# Patient Record
Sex: Male | Born: 1950 | Race: White | Hispanic: No | Smoking: Current some day smoker
Health system: Southern US, Community
[De-identification: ages and names within clinical notes are randomized; demographics above are authoritative.]

## PROBLEM LIST (undated history)

## (undated) DIAGNOSIS — M24549 Contracture, unspecified hand: Secondary | ICD-10-CM

## (undated) DIAGNOSIS — K922 Gastrointestinal hemorrhage, unspecified: Secondary | ICD-10-CM

## (undated) DIAGNOSIS — R4701 Aphasia: Secondary | ICD-10-CM

## (undated) DIAGNOSIS — E7800 Pure hypercholesterolemia, unspecified: Secondary | ICD-10-CM

## (undated) DIAGNOSIS — I10 Essential (primary) hypertension: Secondary | ICD-10-CM

## (undated) DIAGNOSIS — R1310 Dysphagia, unspecified: Secondary | ICD-10-CM

## (undated) DIAGNOSIS — H269 Unspecified cataract: Secondary | ICD-10-CM

## (undated) DIAGNOSIS — F3289 Other specified depressive episodes: Secondary | ICD-10-CM

## (undated) DIAGNOSIS — M249 Joint derangement, unspecified: Secondary | ICD-10-CM

## (undated) DIAGNOSIS — E785 Hyperlipidemia, unspecified: Secondary | ICD-10-CM

## (undated) DIAGNOSIS — G8110 Spastic hemiplegia affecting unspecified side: Secondary | ICD-10-CM

## (undated) DIAGNOSIS — I69921 Dysphasia following unspecified cerebrovascular disease: Secondary | ICD-10-CM

## (undated) DIAGNOSIS — I639 Cerebral infarction, unspecified: Secondary | ICD-10-CM

## (undated) DIAGNOSIS — R4182 Altered mental status, unspecified: Secondary | ICD-10-CM

## (undated) DIAGNOSIS — I6330 Cerebral infarction due to thrombosis of unspecified cerebral artery: Secondary | ICD-10-CM

## (undated) DIAGNOSIS — I6990 Unspecified sequelae of unspecified cerebrovascular disease: Secondary | ICD-10-CM

## (undated) DIAGNOSIS — K559 Vascular disorder of intestine, unspecified: Secondary | ICD-10-CM

## (undated) DIAGNOSIS — R609 Edema, unspecified: Secondary | ICD-10-CM

## (undated) DIAGNOSIS — E039 Hypothyroidism, unspecified: Secondary | ICD-10-CM

## (undated) DIAGNOSIS — R293 Abnormal posture: Secondary | ICD-10-CM

## (undated) DIAGNOSIS — F411 Generalized anxiety disorder: Secondary | ICD-10-CM

## (undated) DIAGNOSIS — F329 Major depressive disorder, single episode, unspecified: Secondary | ICD-10-CM

## (undated) DIAGNOSIS — K219 Gastro-esophageal reflux disease without esophagitis: Secondary | ICD-10-CM

## (undated) DIAGNOSIS — M6281 Muscle weakness (generalized): Secondary | ICD-10-CM

## (undated) DIAGNOSIS — R1110 Vomiting, unspecified: Secondary | ICD-10-CM

## (undated) HISTORY — DX: Cerebral infarction, unspecified: I63.9

## (undated) HISTORY — DX: Essential (primary) hypertension: I10

## (undated) HISTORY — DX: Hyperlipidemia, unspecified: E78.5

## (undated) HISTORY — DX: Unspecified cataract: H26.9

---

## 2000-10-06 ENCOUNTER — Emergency Department (HOSPITAL_COMMUNITY): Admission: EM | Admit: 2000-10-06 | Discharge: 2000-10-06 | Payer: Self-pay | Admitting: Emergency Medicine

## 2000-10-06 ENCOUNTER — Encounter: Payer: Self-pay | Admitting: Emergency Medicine

## 2000-12-02 ENCOUNTER — Encounter: Payer: Self-pay | Admitting: Chiropractic Medicine

## 2000-12-02 ENCOUNTER — Ambulatory Visit (HOSPITAL_COMMUNITY): Admission: RE | Admit: 2000-12-02 | Discharge: 2000-12-02 | Payer: Self-pay | Admitting: Chiropractic Medicine

## 2001-12-09 ENCOUNTER — Encounter: Admission: RE | Admit: 2001-12-09 | Discharge: 2001-12-09 | Payer: Self-pay | Admitting: Internal Medicine

## 2001-12-09 ENCOUNTER — Encounter: Payer: Self-pay | Admitting: Internal Medicine

## 2001-12-15 ENCOUNTER — Encounter: Payer: Self-pay | Admitting: Internal Medicine

## 2001-12-15 ENCOUNTER — Encounter: Admission: RE | Admit: 2001-12-15 | Discharge: 2001-12-15 | Payer: Self-pay | Admitting: Internal Medicine

## 2002-03-13 ENCOUNTER — Encounter: Payer: Self-pay | Admitting: Specialist

## 2002-03-14 ENCOUNTER — Ambulatory Visit (HOSPITAL_COMMUNITY): Admission: RE | Admit: 2002-03-14 | Discharge: 2002-03-15 | Payer: Self-pay | Admitting: Specialist

## 2002-03-14 ENCOUNTER — Encounter: Payer: Self-pay | Admitting: Specialist

## 2002-04-04 ENCOUNTER — Emergency Department (HOSPITAL_COMMUNITY): Admission: EM | Admit: 2002-04-04 | Discharge: 2002-04-04 | Payer: Self-pay | Admitting: Emergency Medicine

## 2002-04-23 ENCOUNTER — Encounter: Payer: Self-pay | Admitting: Internal Medicine

## 2002-04-23 ENCOUNTER — Inpatient Hospital Stay (HOSPITAL_COMMUNITY): Admission: EM | Admit: 2002-04-23 | Discharge: 2002-05-15 | Payer: Self-pay | Admitting: Emergency Medicine

## 2002-04-23 ENCOUNTER — Encounter: Payer: Self-pay | Admitting: *Deleted

## 2002-04-24 ENCOUNTER — Encounter: Payer: Self-pay | Admitting: Internal Medicine

## 2002-04-24 ENCOUNTER — Encounter (INDEPENDENT_AMBULATORY_CARE_PROVIDER_SITE_OTHER): Payer: Self-pay | Admitting: *Deleted

## 2002-04-25 ENCOUNTER — Encounter: Payer: Self-pay | Admitting: Internal Medicine

## 2002-04-28 ENCOUNTER — Encounter (INDEPENDENT_AMBULATORY_CARE_PROVIDER_SITE_OTHER): Payer: Self-pay | Admitting: Cardiology

## 2002-04-29 ENCOUNTER — Encounter: Payer: Self-pay | Admitting: Neurology

## 2002-04-29 ENCOUNTER — Encounter: Payer: Self-pay | Admitting: Internal Medicine

## 2002-05-02 ENCOUNTER — Encounter: Payer: Self-pay | Admitting: Internal Medicine

## 2002-05-04 ENCOUNTER — Encounter: Payer: Self-pay | Admitting: Urology

## 2006-12-22 ENCOUNTER — Inpatient Hospital Stay (HOSPITAL_COMMUNITY): Admission: EM | Admit: 2006-12-22 | Discharge: 2007-01-18 | Payer: Self-pay | Admitting: Emergency Medicine

## 2006-12-22 ENCOUNTER — Ambulatory Visit: Payer: Self-pay | Admitting: Critical Care Medicine

## 2006-12-24 ENCOUNTER — Encounter (INDEPENDENT_AMBULATORY_CARE_PROVIDER_SITE_OTHER): Payer: Self-pay | Admitting: General Surgery

## 2006-12-27 ENCOUNTER — Ambulatory Visit: Payer: Self-pay | Admitting: Gastroenterology

## 2007-01-02 ENCOUNTER — Ambulatory Visit: Payer: Self-pay | Admitting: Infectious Diseases

## 2007-04-19 ENCOUNTER — Ambulatory Visit (HOSPITAL_COMMUNITY): Admission: RE | Admit: 2007-04-19 | Discharge: 2007-04-19 | Payer: Self-pay | Admitting: Internal Medicine

## 2007-08-10 ENCOUNTER — Ambulatory Visit (HOSPITAL_COMMUNITY): Admission: RE | Admit: 2007-08-10 | Discharge: 2007-08-10 | Payer: Self-pay | Admitting: Orthopedic Surgery

## 2007-09-09 ENCOUNTER — Ambulatory Visit (HOSPITAL_COMMUNITY): Admission: RE | Admit: 2007-09-09 | Discharge: 2007-09-09 | Payer: Self-pay | Admitting: Gastroenterology

## 2008-04-13 IMAGING — CR DG CHEST 1V PORT
1 series · 1 of 1 positions shown · non-contrast
Comparison: 01/13/07.

CLINICAL DATA: Follow-up pneumonia. 
 PORTABLE CHEST - 1 VIEW:

[view not recorded]
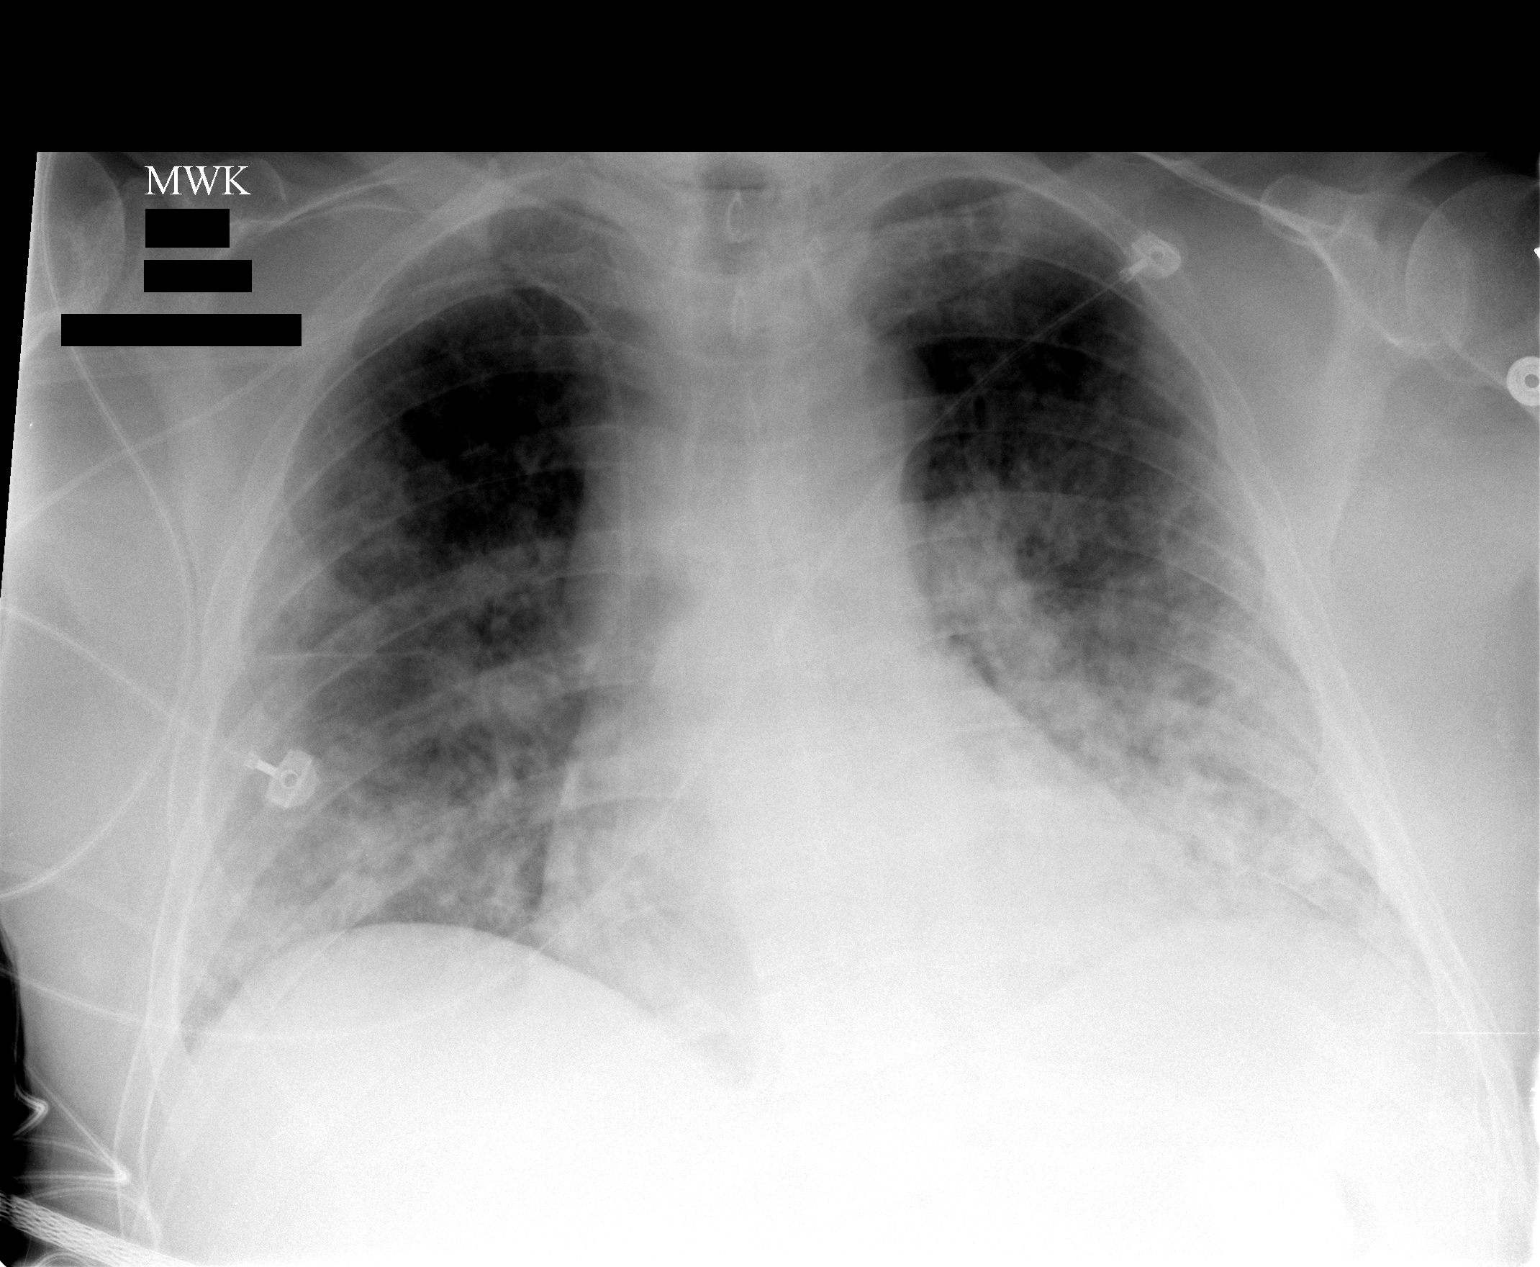

[1 of 1 positions shown; findings below may reference images not displayed]

FINDINGS: The right arm PICC line tip is in the right atrium.  
 There is diffuse bilateral interstitial and air space opacities unchanged compared to prior study.  
 No new findings are identified.  There has been removal of the feeding tube.
IMPRESSION: 1.  PICC line tip in right atrium. 
 2.  No change in aeration of the lungs.

## 2008-05-13 ENCOUNTER — Inpatient Hospital Stay (HOSPITAL_COMMUNITY): Admission: EM | Admit: 2008-05-13 | Discharge: 2008-05-16 | Payer: Self-pay | Admitting: Emergency Medicine

## 2008-05-15 ENCOUNTER — Encounter (INDEPENDENT_AMBULATORY_CARE_PROVIDER_SITE_OTHER): Payer: Self-pay | Admitting: Gastroenterology

## 2009-06-13 ENCOUNTER — Encounter: Admission: RE | Admit: 2009-06-13 | Discharge: 2009-06-13 | Payer: Self-pay | Admitting: Internal Medicine

## 2010-05-14 LAB — COMPREHENSIVE METABOLIC PANEL
AST: 23 U/L (ref 0–37)
Albumin: 3.8 g/dL (ref 3.5–5.2)
Calcium: 9.5 mg/dL (ref 8.4–10.5)
GFR calc Af Amer: >60 mL/min (ref >60)
GFR calc non Af Amer: >60 mL/min (ref >60)
Glucose, Bld: 125 mg/dL — ABNORMAL HIGH (ref 70–99)
Sodium: 141 mEq/L (ref 135–145)
Total Bilirubin: 0.5 mg/dL (ref 0.3–1.2)
Total Protein: 7.1 g/dL (ref 6.0–8.3)

## 2010-05-14 LAB — URINE MICROSCOPIC-ADD ON

## 2010-05-14 LAB — BASIC METABOLIC PANEL
BUN: 7 mg/dL (ref 6–23)
CO2: 27 mEq/L (ref 19–32)
CO2: 27 mEq/L (ref 19–32)
Calcium: 8.4 mg/dL (ref 8.4–10.5)
Calcium: 8.6 mg/dL (ref 8.4–10.5)
Calcium: 8.8 mg/dL (ref 8.4–10.5)
Creatinine, Ser: 0.98 mg/dL (ref 0.4–1.5)
Creatinine, Ser: 1.08 mg/dL (ref 0.4–1.5)
GFR calc Af Amer: >60 mL/min (ref >60)
GFR calc Af Amer: >60 mL/min (ref >60)
GFR calc non Af Amer: >60 mL/min (ref >60)
Glucose, Bld: 107 mg/dL — ABNORMAL HIGH (ref 70–99)
Glucose, Bld: 95 mg/dL (ref 70–99)

## 2010-05-14 LAB — PROTIME-INR
INR: 1.1 (ref 0.00–1.49)
INR: 1.2 (ref 0.00–1.49)
INR: 1.2 (ref 0.00–1.49)
Prothrombin Time: 15.5 seconds — ABNORMAL HIGH (ref 11.6–15.2)
Prothrombin Time: 15.6 seconds — ABNORMAL HIGH (ref 11.6–15.2)
Prothrombin Time: 39.2 seconds — ABNORMAL HIGH (ref 11.6–15.2)

## 2010-05-14 LAB — URINALYSIS, ROUTINE W REFLEX MICROSCOPIC
Glucose, UA: NEGATIVE mg/dL
Nitrite: NEGATIVE
Urobilinogen, UA: 0.2 mg/dL (ref 0.0–1.0)
pH: 5.5 (ref 5.0–8.0)

## 2010-05-14 LAB — CBC
HCT: 37.3 % — ABNORMAL LOW (ref 39.0–52.0)
HCT: 43.5 % (ref 39.0–52.0)
Hemoglobin: 14.9 g/dL (ref 13.0–17.0)
MCHC: 34.7 g/dL (ref 30.0–36.0)
MCHC: 35.2 g/dL (ref 30.0–36.0)
MCV: 92.5 fL (ref 78.0–100.0)
Platelets: 170 10*3/uL (ref 150–400)
RBC: 4.15 MIL/uL — ABNORMAL LOW (ref 4.22–5.81)
RDW: 12.6 % (ref 11.5–15.5)
RDW: 12.7 % (ref 11.5–15.5)
RDW: 12.8 % (ref 11.5–15.5)
RDW: 13 % (ref 11.5–15.5)

## 2010-05-14 LAB — HEMOGLOBIN AND HEMATOCRIT, BLOOD
HCT: 38 % — ABNORMAL LOW (ref 39.0–52.0)
HCT: 40.4 % (ref 39.0–52.0)
Hemoglobin: 13.1 g/dL (ref 13.0–17.0)
Hemoglobin: 13.5 g/dL (ref 13.0–17.0)
Hemoglobin: 14.9 g/dL (ref 13.0–17.0)

## 2010-05-14 LAB — PREPARE FRESH FROZEN PLASMA

## 2010-05-14 LAB — DIFFERENTIAL
Lymphocytes Relative: 19 % (ref 12–46)
Neutro Abs: 11.4 10*3/uL — ABNORMAL HIGH (ref 1.7–7.7)

## 2010-05-14 LAB — APTT: aPTT: 67 seconds — ABNORMAL HIGH (ref 24–37)

## 2010-05-14 LAB — ABO/RH: ABO/RH(D): A POS

## 2010-05-14 LAB — TYPE AND SCREEN: ABO/RH(D): A POS

## 2010-06-17 NOTE — Discharge Summary (Signed)
NAMEIZAIHA, LO               ACCOUNT NO.:  [id number]   MEDICAL RECORD NO.:  [id number]          PATIENT TYPE:  INP   LOCATION:  5525                         FACILITY:  [facility]   PHYSICIAN:  Lucita Ferrara, MD         DATE OF BIRTH:  1950-03-17   DATE OF ADMISSION:  12/22/2006  DATE OF DISCHARGE:  01/18/2007                               DISCHARGE SUMMARY   ADMISSION DIAGNOSES:  1. Small bowel obstruction.  2. Coffee-ground emesis/upper gastrointestinal bleed.  3. Subtherapeutic INR.  4. Cerebrovascular disease.  5. Hypertension.  6. Tobacco abuse.  7. Hypothyroidism.  8. Hyperlipidemia.   CONSULTATIONS:  1. Dr. Charlott Rakes, [facility] for placement of a PEG      tube.  2. Dr. Janee Morn.   REASON FOR CONSULTATION:  Small bowel obstruction.   PROCEDURES:  sigmoid colectomy/colostomy on December 24, 2006.   ASSISTED LIVING FACILITY:  [facility].   BRIEF HISTORY OF PRESENT ILLNESS:  Ms. Granade is a resident of [facility] with chronic debilitation secondary to stroke in 2004,  right-sided hemiplegia, wheelchair bound, presented to [facility] on December 22, 2006 with nausea and vomiting x4, coffee-  ground emesis, no amenability to outpatient diagnosis workup and  management, including attempts at fecal disimpaction.  Given that  patient was on chronic Coumadin therapy for patent foramen ovale and a x-  ray consistent with a small bowel obstruction on December 22, 2006 and  the supra-therapeutic INRs of 4.4, the patient was admitted for bowel  rest, IV fluids and G-tube placement, vitamin K and monitoring of his  hemoglobin and hematocrit.  The patient was admitted to the telemetry  unit.   HOSPITAL COURSE:  1. Small bowel obstruction- Gastroenterology was consulted on December 22, 2006, Dr. Janee Morn, who agreed with the insertion of an NG tube      and decompression of the abdomen. Patient was kept on  aggresive      hydration snf NPO.  Abdominal x-ray showed small obstruction but      with ? gas in the right colon region, suspicion of colonic      obstruction.  CT scan confirmed this diagnosis.  The patient in the      interim became septic , diaphoretic, aspirated and was transferred      to the intensive care unit on December 23, 2006, 23:45 p.m.  The      patient was started on Zosyn. The patient underwent a sigmoid      colectomy/colostomy on December 24, 2006.  Post-op Hospital course      was complicated by vent-dependent respiratory failure/left lower      lobe pneumonia and septicemia. Bronchoscopy was performed  and      secretions were removed, anc cultured positive with e-coli.  In the      interim, the patient was not weanable off the ventilator, secondary      to persistent ARDS, septicemia and pleural effusion. In the      interim, the patient also had elevated  LFTs thought to be secondary      to a combination of sepsis and TPN.  Panda tube was placed      Infectious disease consult was also called in, recommended broad-      spectrum antibiotics with Imipenem, vancomycin, given culture-      positive BAL showing E. coli, also given persistent pleural      effusion, underlying pneumonia, a thoracentesis showed transudative      fluid.  The patient was extubated on January 05, 2007.   1. Neuro.  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  This was a barrier to weaning from the vent.  2. Hypothyroidism.  During this hospital course, the patient was kept      on his dose of Synthroid. TSH was WNL.   1. Severe Dysphagia.  On January 13, 2007, the patient underwent a      swallowing evaluation, which he failed, and the recommendation was      to keep the patient NPO and to try vital stimulation for      improvement of pharyngeal muscles.  Decision was made with      consultation with his mother to get a PEG tube.  He underwent PEG      tube  procedure on January 14, 2007.   DISCHARGE MEDICATIONS AND TREATMENTS:  The patient will be discharged  back to the skilled nursing facility with the following medications and  treatments.  1. Lovenox 40 mg sub-cu daily.  2. Fentanyl Duragesic patch 50 mcg q.72 hours.  3. Advair 1 puff b.i.d.  4. Combivent 2 puffs q.4 hours.  5. Synthroid 125 mcg daily.  6. Reglan 10 mg IV q.6 hours.  7. Nutritional supplementation with osmolyte 1.5 at goal of 6 mL per      hour plus 4 scoops of Bene-protein Total per day via PEG.  8. Protonix 40 mg per PEG daily.  9. Water irrigation sterile, 50 mL tube q.8 hours.  10.Tylenol 650 per rectum q.6 hours p.r.n.  11.Albuterol high-flow nebulizer q.2 hours p.r.n.  12.Zofran 4 mg IV q.6 hours p.r.n.   DISCHARGE DIAGNOSES:  1. Small bowel obstruction/status post laparotomy November 21 for      sigmoid volvulus.  2. Status post respiratory failure, pneumonia, acute respiratory      distress syndrome.  3. History of chronic obstructive pulmonary disease.  4. History of cerebrovascular accident with left-sided hemiplegia,      aphagia anddysphagia.  5. Status post colostomy.  6. Hypothyroidism on Synthroid.  7. Anemia, chronic.   VITAL SIGNS:  On day of discharge, vital signs:  Temperature 99.5, pulse  93, respirations 22, blood pressure 107/54, pulse ox 96% on 4 liters.  HEENT:  Normocephalic, atraumatic, sclerae anicteric.  NECK:  Supple, no JVD.  CARDIOVASCULAR:  S1, S2, regular rate and rhythm.  No murmurs, rubs,  clicks.  ABDOMEN:  Soft.  PEG tube site clean, dry and intact.  Colonoscopy  patent, and site is clean, dry and intact.  LUNGS:  Clear to auscultation bilaterally.  No rhonchi, rales or  wheezes.  EXTREMITIES:  No clubbing, cyanosis or edema.   LABORATORY DATA:  White count 9.8, hemoglobin 8.9, hematocrit 26.4,  platelets 398.  Patient is going to be going to skilled nursing facility  for continued rehab.  All arrangements have been  made, and I have spoken  to his mother.      Lucita Ferrara, MD  Electronically Signed     RR/MEDQ  D:  01/18/2007  T:  01/18/2007  Job:  [id number]

## 2010-06-17 NOTE — Consult Note (Signed)
Brett Fletcher, Brett Fletcher               ACCOUNT NO.:  [id number]   MEDICAL RECORD NO.:  [id number]          PATIENT TYPE:  INP   LOCATION:  5525                         FACILITY:  [facility]   PHYSICIAN:  Shirley Friar, MDDATE OF BIRTH:  1950-03-31   DATE OF CONSULTATION:  01/13/2007  DATE OF DISCHARGE:                                 CONSULTATION   REASON FOR CONSULTATION:  Feeding difficulties.  Evaluate for PEG tube  placement.   HISTORY OF PRESENT ILLNESS:  Brett Fletcher is a 60 year old white male  recovering from a small-bowel obstruction which led to exploratory  laparotomy and sigmoid colectomy.  He has a colostomy with a Hartmann's  pouch.  He was found to have a sigmoid volvulus during his surgery.  Postprocedure, he has been treated for Escherichia coli pneumonia.   MEDICATIONS:  See chart.   PHYSICAL EXAMINATION:  VITAL SIGNS:  Temperature 98.5, pulse 95, blood  pressure 123/70, O2 saturations 98% on 2 liters.  GENERAL:  No acute distress.  ABDOMEN:  Ostomy intact, not removed, healing midline surgical incision,  soft, nondistended, positive bowel sounds, nontender.   LABORATORIES:  Hemoglobin 8.8, white blood count 11.4, platelet count  616.  BUN 22, creatinine 0.82, total bilirubin 1.8, ALP 159, AST 37, ALT  49.   IMPRESSION:  A 60 year old white male with feeding difficulties and  dysphagia, in need of a more permanent feeding tube.  Based on his  recent surgeries, including sigmoid volvulus requiring sigmoid colectomy  and colostomy, I would  recommend a PEG tube placement by radiology with  fluoroscopy instead of endoscopically.  We will place an water for  radiologic placement.  Please call if we can be of any further  assistance.      Shirley Friar, MD  Electronically Signed     VCS/MEDQ  D:  01/13/2007  T:  01/14/2007  Job:  [id number]

## 2010-06-17 NOTE — Op Note (Signed)
Brett Fletcher, Brett Fletcher               ACCOUNT NO.:  [id number]   MEDICAL RECORD NO.:  [id number]          PATIENT TYPE:  AMB   LOCATION:  SDS                          FACILITY:  [facility]   PHYSICIAN:  Artist Pais. 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   PROCEDURE:  Right hand superficialis to profundus tendon transfers as  well as Z-lengthening of flexor pollicis longus tendon, right hand.   SURGEON:  Artist Pais. Mina Marble, MD.   ASSISTANT:  None.   ANESTHESIA:  General.   TOURNIQUET TIME:  53 minutes.   COMPLICATIONS:  None.   DRAINS:  None.   OPERATIVE REPORT:  The patient was taken to the operating suite.  After  induction of adequate general anesthesia, the right upper extremity was  prepped draped in usual sterile fashion.  An Esmarch was used to  exsanguinate the limb.  Tourniquet was inflated to 275 mmHg.  At this  point in time, an incision was made over the carpal tunnel in [place]  fashion across the wrist crease and out of the proximal forearm.  The  skin was incised.  The median nerve was decompressed to the carpal  tunnel, identified proximally and distally, and retracted using a vessel  loop.  After this was done, the superficialis tendons were identified.  They were grouped together in the distal aspect of the wound in the  palm.  There were tied together using 2-0 Ethibond suture and then  transected as distal as possible.  There were drawn proximally into the  wound.  The profundus tendons were then carefully identified, grouped  together, and sewn together with 2-0 Ethibond and transected the  musculotendinous junction.  The fingers were then fully extended.  The  FPL tendon was then  Z-lengthened out to the appropriate length.  The  tendons were then irrigated and repaired using 2-0 Ethibond in  horizontal mattress sutures for both the FPL as well as the  superficialis and profundus tendons.  After this was done, the wound was  thoroughly irrigated and closed loosely with staples.  Xeroform, 4 x  4's, fluffs, and a volar splint was applied.  The patient tolerated the  procedures well and went to recovery room in stable fashion.      Artist Pais Mina Marble, M.D.  Electronically Signed     MAW/MEDQ  D:  08/10/2007  T:  08/11/2007  Job:  [id number]

## 2010-06-17 NOTE — Consult Note (Signed)
NAMEALEXUS, Brett Fletcher NO.:  [id number]   MEDICAL RECORD NO.:  [id number]          PATIENT TYPE:  EMS   LOCATION:  MAJO                         FACILITY:  [facility]   PHYSICIAN:  Gabrielle Dare. Janee Morn, M.D.DATE OF BIRTH:  1950/09/23   DATE OF CONSULTATION:  12/22/2006  DATE OF DISCHARGE:                                 CONSULTATION   REASON FOR CONSULTATION:  Small-bowel obstruction.   HISTORY OF PRESENT ILLNESS:  This is a 60 year old white male with a  history of a cerebrovascular accident, aphasia, depression,  hypertension, patent foramen ovale, pneumonia and spinal stenosis, who  presents today with about a week of abdominal pain.  This patient is a  poor historian due to his aphasia, and therefore most of his history  comes from his mother.  This history seems to be somewhat sporadic  because the patient lives in an assisted living home, and therefore the  mom is not present with him all the time.   The patient states that he has been having abdominal pain for about a  week.  However, the mom says that he has been having abdominal pain for  about 3 weeks.  Apparently, at the assisted living home, he was having  some bouts of constipation that was causing abdominal pain.  The mom  states the facility gave him several enemas, which seemed to make the  patient better.  However, within the past week, the patient has had an  increase in abdominal pain.  He has not been eating very well.  For the  past 2 days, he has began to get nauseated and start vomiting.  They  described the vomit as coffee-ground type emesis.  When asked about any  blood in his stools, the mom states that the patient would not be able  to tell because he wears a diaper and is incontinent and they cleaned  him, so he has no visualization of his stools usually.  However, today,  he is hemoccult positive.  At the time of his presentation to the ER,  his white cell count is 11,100.  His hemoglobin is  17.0 with neutrophils  82%.  An abdominal x-ray was obtained, which does show a small-bowel  obstruction.   PAST MEDICAL HISTORY:  1. Aphasia.  2. Cerebrovascular accident.  3. Depression.  4. Hypertension.  5. Patent foramen ovale.  6. Pneumonia.  7. Spinal stenosis.  8. Hypothyroidism.   PAST SURGICAL HISTORY:  1. He had neck surgery in 2003.  2. He had tonsillectomy when he was younger.  3. He also had a cystoscopy in 2004 for gross hematuria.   SOCIAL HISTORY:  This patient lives in an assisted living facility and  is widowed.  He was a chain-smoker of about 2 packs per day since he was  in his teens; however, since going into assisted living, he has cut his  smoking down to 6 cigarettes a day.  He is a former heavy drinker noted  in his history of about 3 quarts of alcohol a day; however, now he is  unable to drink due  to being in assisted living.   MEDICATIONS:  1. Baclofen 10 mg.  2. Coumadin 7 mg.  3. Norvasc 5 mg.  4. Synthroid 50 mcg.  5. Wellbutrin 300 mg.  6. Lipitor 40 mg.   ALLERGIES:  NKDA.   REVIEW OF SYSTEMS:  See HPI.  Other systems were unable to be obtained  due to the inability to communicate well with this patient.   PHYSICAL EXAMINATION:  GENERAL:  This is a well-developed, well-  nourished, 60 year old white male who appears in no acute distress;  however, is frustrated with his situation.  VITAL SIGNS:  Temperature is 98.3, pulse 95, respirations 18, blood  pressure 125/76.  HEENT:  Head is normocephalic, atraumatic.  Sclerae noninjected.  He  does have poor dentition, and is missing 1 of his front teeth.  NECK:  Supple with no lymphadenopathy or thyromegaly.  CHEST:  Clear to auscultation bilaterally with normal respiratory  effort, with no wheezes, rhonchi or rales auscultated.  HEART:  Regular rhythm, and is mildly tachycardic at 95 beats per  minute.  ABDOMEN:  Soft, obese.  It is distended.  He has decreased bowel sounds  with diffuse  tenderness.  He has a small umbilical hernia noted that  reduces.  He has no guarding, no masses are felt, and no rebound  tenderness.  EXTREMITIES:  All four extremities show no evidence of cyanosis,  clubbing or edema.  Right side hemiplegia.  Left sided extremities are  moving.  NEUROLOGIC:  The patient is alert and oriented x3.  He has a right  hemiplegia.  He currently moves his left sided extremities without any  focal deficits noted.   LABS AND DIAGNOSTICS:  White blood cell count is 11,100, hemoglobin is  17.0, neutrophils are 82%.  Sodium 142, potassium 4.0, BUN 32,  creatinine 1.3.  He is hemoccult positive.  PT is 43.8 seconds, and INR  is 4.4.  Abdominal x-ray taken in the ER shows a small-bowel  obstruction.   IMPRESSION:  1. Small-bowel obstruction.  2. Coffee-ground emesis.  3. Supratherapeutic INR.  4. History of cerebrovascular accident.  5. Hypertension.  6. Dysphasia.  7. Patent foramen ovale.  8. Spinal stenosis.  9. Depression.  10.Hypothyroidism.   PLAN:  At this point in time, we agree with the insertion of an NG-tube  to help with decompression of the patient's abdomen.  We also agree with  aggressive hydration.  At this point in time, we will keep him on a  strict n.p.o. status.  Tomorrow morning, we will obtain another 2-view  abdominal x-ray to see if the NG-tube has done a decent job of  decompressing his abdomen.  We will also obtain a CT of the abdomen and  pelvis with oral and IV contrast to check for any potential masses or  lesions that would be causing the obstruction that would also cause his  stools to be  hemoccult positive.  The oral contrast will be given via his NG-tube.  We will also follow any labs that Dr. Angelena Sole has ordered for tomorrow  morning.  At this point in time, any further recommendations will be per  Dr. Janee Morn after his evaluation of the patient.      Letha Cape, PA      Gabrielle Dare. Janee Morn, M.D.   Electronically Signed    KEO/MEDQ  D:  12/22/2006  T:  12/22/2006  Job:  [id number]

## 2010-06-17 NOTE — Discharge Summary (Signed)
NAMECADE, DASHNER NO.:  [id number]   MEDICAL RECORD NO.:  [id number]          PATIENT TYPE:  INP   LOCATION:  1436                         FACILITY:  [facility]   PHYSICIAN:  Isidor Holts, M.D.  DATE OF BIRTH:  12/07/1950   DATE OF ADMISSION:  05/12/2008  DATE OF DISCHARGE:  05/16/2008                               DISCHARGE SUMMARY   PMD:  Maxwell Caul, M.D., [facility].   DISCHARGE DIAGNOSES:  1. Hematochezia/lower gastrointestinal bleed.  2. Endoscopically confirmed ischemic colitis/sessile polyps causing #1      above.  3. History of cerebrovascular accident with right hemiparesis, 2004,      secondary to patent foramen ovale.  4. Chronic anticoagulation.  5. Coumadin induced coagulopathy, exacerbating #1 above.  6. Hypertension.  7. Hypothyroidism.  8. History of depression.  9. History of cervical degenerative joint disease, status post      diskectomy and fusion.  10.History of bowel obstruction, December, 2008, status post sigmoid      colectomy/colostomy   DISCHARGE MEDICATIONS:  1. Synthroid 75 mcg p.o. daily.  2. Simvastatin 20 mg p.o. nightly.  3. Fentanyl (75 mcg/hr) one patch to skin q.72h.  4. Advair Diskus (250/50) one puff b.i.d.  5. Baclofen 20 mg p.o. p.r.n. t.i.d.  6. Wellbutrin XL 300 mg p.o. daily.  7. Omeprazole 20 mg p.o. daily.  8. Reglan 5 mg p.o. q.i.d.  9. Combivent inhaler 2 puffs p.r.n. q.i.d.  10.Centrum multivitamin one p.o. daily.  11.Coumadin per INR. Currently on 5 mg p.o. q. 6 p.m. daily.  12.Norvasc 10 mg p.o. daily.   Note: Phenergan has been discontinued, until reevaluated by PMD.   PROCEDURES.:  1. Abdominal/chest x-ray dated May 12, 2008.  This showed normal      bowel gas, mild cardiac enlargement without acute pulmonary      process.  2. Colonoscopy done May 15, 2008 by Jordan Hawks. Elnoria Howard, MD.  This      showed sessile polyps and ischemic colitis.  These were between 5-6      mm in the  ascending colon.  These were removed with the hot snare      while evidence of ischemic colitis was noted at the ascending colon      and biopsies were obtained.  This was an uncomplicated procedure.   CONSULTATIONS:  1. Georgiana Spinner, M.D., gastroenterologist.  2. Jordan Hawks. Elnoria Howard, MD, gastroenterologist.   ADMISSION HISTORY:  As in H and P notes of May 13, 2008, dictated by  Della Goo, M.D.  However, in brief, this is a 60 year old male,  with known history of cerebrovascular accident in 2004, with residual  dysphasia and right-sided hemiparesis deemed secondary to patent foramen  ovale, on chronic anticoagulation, hypertension, cervical degenerative  disk disease status post diskectomy and fusion, history of bowel  obstruction December of 2008, status post sigmoid colectomy and  colostomy, dyslipidemia, dysthyroidism, depression, brought to the  emergency department from nursing facility after staff noted dark blood  in the patient's colostomy bag.  He was admitted for further evaluation,  investigation and  management.   CLINICAL COURSE.:  1. Lower GI bleed.  The patient presented with hematochezia, described      as dark blood in colostomy bag, of 1 day duration.  He was      hemodynamically stable.  Hemoglobin was normal at 14.9.  He had no      hematemesis.  He was managed with bowel rest, intravenous fluid      hydration, serial hemoglobin/hematocrit, intravenous proton-pump-      inhibitor  treatment.  Coumadin was discontinued.  GI consultation      was called, which was initially provided by Georgiana Spinner, M.D. who      arranged colonoscopy evaluation.  This was carried out on May 15, 2008, by Jordan Hawks. Elnoria Howard, MD, and demonstrated descending colonic      ischemic colitis, as well as two sessile 5-6 mm polyps in the      ascending colon, which were removed by hot snare.  The patient's      hemoglobin remained stable and normal  throughout the course of his       hospitalization.  As matter of fact on May 15, 2008, hemoglobin      was 13 with a hematocrit of 37.5.   1. Coumadin induced coagulopathy.  The patient is on chronic      anticoagulation, against a background of previous CVA deemed      secondary to a PFO.  At the time of presentation, his INR was      mildly supratherapeutic at 3.6.  It was felt that this may be      exacerbating #1 above.  anticoagulation was therefore reversed with      intravenous infusion of fresh frozen plasma.  By May 13, 2008,      INR was subtherapeutic at 1.7 and had normalized at 1.2 by May 15, 2008.  Following the patient's colonoscopy findings, Jordan Hawks.      Elnoria Howard, MD, gastroenterologist recommended that anticoagulation may      be recommenced, but with a target INR in the low end of the      therapeutic range.   1. History of CVA with right hemiplegia.  The patient remained stable      from this viewpoint, during the course of his hospitalization.   1. Hypertension.  The patient does not appear to have been on      antihypertensive medications, pre-admission.  He did experience a      gradual trending up of his BP during the course of his      hospitalization, and as matter of fact, blood pressure was 177/91      mmHg on May 15, 2008.  He has been commenced on Norvasc      accordingly.   1. Dysthyroidism.  The patient was continued on pre-admission dosage      of Synthroid.   1. Depression.  The patient's mood remained stable during the course      of his hospitalization.  He has been continued on pre-admission      psychotropic medications.   DISPOSITION:  The patient was on May 15, 2008, asymptomatic.  Hemoglobin was normal and he was considered clinically stable for  discharge to be contemplated on May 16, 2008. Provided no acute  problems occur in the interim, he will be discharged accordingly.   DIET:  Heart-healthy.   ACTIVITY:  As tolerated, otherwise per PT/OT.  FOLLOW UP INSTRUCTIONS:  The patient is to follow up routinely with his  primary MD, Maxwell Caul, M.D. of [facility], per prior  scheduled appointment.   SPECIAL INSTRUCTIONS:  The patient has been started on an initial dose  of 5 mg of Coumadin at 6 p.m. daily.  This will be titrated according to  INR by the patient's primary MD, Dr. Baltazar Najjar.  It is recommended  that INR should be in the low therapeutic range i.e., between 2-2.5.  It  is recommended that PT/INR checked by Dr. Leanord Hawking on the a.m. of May 18, 2008.  Dr. Leanord Hawking will thereafter adjust Coumadin dosage as  indicated.  Continued PT/OT is recommended in the nursing home.   Note: Per Jordan Hawks. Elnoria Howard, MD, gastroenterologist repeat colonoscopy is  recommended in 5 years.      Isidor Holts, M.D.  Electronically Signed     CO/MEDQ  D:  05/15/2008  T:  05/15/2008  Job:  [id number]   cc:   Maxwell Caul, M.D.

## 2010-06-17 NOTE — H&P (Signed)
Brett Fletcher, Brett Fletcher               ACCOUNT NO.:  [id number]   MEDICAL RECORD NO.:  [id number]          PATIENT TYPE:  INP   LOCATION:  1436                         FACILITY:  [facility]   PHYSICIAN:  Della Goo, M.D. DATE OF BIRTH:  1951-01-12   DATE OF ADMISSION:  05/13/2008  DATE OF DISCHARGE:                              HISTORY & PHYSICAL   DATE OF ADMISSION:  May 13, 2008.   PRIMARY CARE PHYSICIAN:  [facility], Dr. Baltazar Najjar.   CHIEF COMPLAINT:  Blood in colostomy bag.   HISTORY OF PRESENT ILLNESS:  This is a 60 year old male who is a nursing  home resident at the [facility] nursing home facility who was  brought to the emergency department for further evaluation after staff  found blood in his colostomy bag which was described as being dark  burgundy blood.  Prior to the afternoon, the patient had not had any  blood in his colostomy bag per note of staff.  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  He, however, denies any discomfort and denies  any nausea or vomiting.  Of note, the patient is on Coumadin therapy  chronically secondary to his history of a patent foramen ovale and a  previous CVA.   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  The patient also has a history of  hypertension, hypothyroidism and depression.   MEDICATIONS:  1. Coumadin 7 mg p.o. daily.  2. Synthroid 75 mcg p.o. daily.  3. Simvastatin 20 mg p.o. q.h.s.  4. Fentanyl.  5. Advair discus 250/50 one inhalation twice daily.  6. Baclofen 20 mg p.r.n.  7. Wellbutrin 300 mg once daily.  8. Omeprazole 20 mg once daily.  9. Reglan 5 mg p.o. q.i.d.  10.Combivent inhaler 2 puffs q.i.d. p.r.n.  11.Phenergan p.r.n.   ALLERGIES:  DOXYCYCLINE.   SOCIAL HISTORY:  The  patient is a nursing home resident.  He has a  remote history of tobacco and alcohol usage.   FAMILY HISTORY:  Unable to obtain.   CODE STATUS:  Please note that the patient's code status has been  discussed with the patient's mother and the patient is a full code.   REVIEW OF SYSTEMS:  Unable to obtain.   PHYSICAL EXAMINATION FINDINGS:  GENERAL:  This is a 60 year old,  agitated, morbidly obese, debilitated male in no discomfort or acute  distress.  VITAL SIGNS:  Temperature 98.5, blood pressure 139/77, heart rate 69,  respirations 18, O2 saturation 97%.  HEENT:  Normocephalic, atraumatic.  Pupils reactive to light  bilaterally.  No scleral icterus.  Extraocular movements are intact.  Nares are patent bilaterally.  Oropharynx is clear.  NECK:  Supple, full range of motion.  No thyromegaly, adenopathy,  jugular venous distention.  CARDIOVASCULAR:  Regular rate and rhythm.  No murmurs, gallops, rubs.  LUNGS:  Clear to auscultation bilaterally.  ABDOMEN:  Positive bowel sounds, soft, obese, nondistended and nontender  to palpation.  No palpable hepatosplenomegaly.  The patient has a  colostomy and the ostomy site is pink and the drainage from the ostomy  is burgundy.  EXTREMITIES:  Without cyanosis, clubbing or edema.   LABORATORY STUDIES:  White blood cell count 15.7, hemoglobin 14.9,  hematocrit 43.5, MCV 92.5, platelets 182, neutrophils 73%, lymphocytes  19%.  ProTime 39.2, INR 3.6, PTT 67.  Sodium 141, potassium 4.1,  chloride 106, carbon dioxide 26, BUN 19, creatinine 1.15 and glucose  125.  Albumin 3.8, AST 23, ALT 32.  Acute abdominal series revealed a  normal bowel/gas pattern, there is no free air, mild cardiac  enlargement, without acute pulmonary disease findings.   ASSESSMENT:  A 60 year old male being admitted with:  1. Gastrointestinal bleeding, possible lower gastrointestinal      bleeding.  2. Coagulopathy secondary to Coumadin therapy.  3. Cerebrovascular  accident history with right-sided hemiparesis and      aphasia.  4. History of patent foramen ovale on chronic Coumadin therapy.   PLAN:  The patient will be admitted and monitored for further drops in  his hemoglobin level.  A type-and-screen has been ordered in the event  that the patient needs to be transfused.  The patient's Coumadin level  will be reversed, hopefully temporarily.  FFP and vitamin K have been  ordered.  His regular medications will be further verified and  continued.  A GI evaluation will be requested for further evaluation of  the patient's bleeding.  An IV heparin drip will also be considered at a  low rate once the patient's PT and INR are less than 1.5.  The patient  will be placed on an IV Protonix drip as well.      Della Goo, M.D.  Electronically Signed     HJ/MEDQ  D:  05/13/2008  T:  05/14/2008  Job:  [id number]   cc:   Maxwell Caul, M.D.

## 2010-06-17 NOTE — H&P (Signed)
NAMEDREYDON, CARDENAS NO.:  [id number]   MEDICAL RECORD NO.:  [id number]          PATIENT TYPE:  EMS   LOCATION:  MAJO                         FACILITY:  [facility]   PHYSICIAN:  Hettie Holstein, D.O.    DATE OF BIRTH:  1950/05/05   DATE OF ADMISSION:  12/22/2006  DATE OF DISCHARGE:                              HISTORY & PHYSICAL   PRIMARY CARE PHYSICIAN:  Dr. Baltazar Najjar.   ASSISTED LIVING FACILITY:  [facility]   CHIEF COMPLAINT:  Coffee-ground emesis.   HISTORY OF PRESENT ILLNESS:  Mr. Mcsweeney is a resident of [facility] who had a stroke in 2004 with a right-sided hemiplegia and is  essentially wheelchair bound, who had been having episodes of nausea and  vomiting for the past couple of days approximately three to four  episodes.  In any event, he has been struggling also with a distended  abdomen that he has had multiple radiographs in the outpatient setting.  Records here reveal that he had a radiograph on the 12th that revealed  likely fecal impaction as described above and an ileus at that time and  he has undergone stool disimpaction and continued to have a distended  bowel and developed coffee-ground emesis over the past couple of days.  He is on chronic Coumadin therapy as described above for patent foramen  ovale.  He continues to remain distended in the emergency department, is  not actively vomiting, but his radiograph again reveals his persistent  small-bowel obstruction.  Rectal exam performed in the emergency  department revealed hemoccult positive brown stool.  There was no  evidence of fecal impaction on the digital examination.  In any event,  his INR was 4.4.  His hemoglobin was 17.  His BUN was 32.   General surgery was well as gastroenterology were asked to follow with  Mr. Yawn during his hospital course.   PAST MEDICAL HISTORY:  As noted above, significant for  1. Right hemiplegia secondary to a CVA in 2004, felt to  be due to a      patent foramen ovale on chronic Coumadin therapy.  2. Status post anterior cervical diskectomy and cervical fusion.  3. Hypertension.  4. Hypothyroidism.  5. Depression.  6. Tobacco dependence and remote alcohol abuse prior to his stroke.   ALLERGIES:  NO KNOWN DRUG ALLERGIES.   MEDICATIONS AT THE SKILLED FACILITY:  These include  1. Baclofen 10 mg p.o. t.i.d.  2. Synthroid 50 mcg daily.  3. Wellbutrin XL 300 mg daily.  4. Norvasc 5 mg daily.  5. Multivitamin daily.  6. Vitamin D 50,000 units p.o.  7. Coumadin 7.5 mg on Monday, Wednesday, Thursday, Friday, Saturday      and Sunday and 4 mg on Tuesday.  8. Lipitor 40 mg daily.  9. Dulcolax suppositories p.o. daily.   SOCIAL HISTORY:  The patient resides at [facility].  His code  status is full.  He is a half a pack per day smoker longstanding, former  drinker.  His family is with him here today.  His mother  reachable at  [phone] and his son at [phone].   FAMILY HISTORY:  This was discussed with family and there was no history  of cancer they can describe, no prior history of strokes or heart  disease that they are aware of.   REVIEW OF SYSTEMS:  He has been in his usual state of health with the  exception of this bowel distention.  He has been mobile with a  wheelchair using his left foot to propel.  Otherwise, he has had no  chest pain or shortness of breath.  He does reveal some subjective  chills, though no fevers recorded.   PHYSICAL EXAMINATION:  VITAL SIGNS:  In the emergency department his  blood pressure was 125/76, temperature 90.3, heart rate 95, respirations  18, O2 saturation 97%.  HEENT:  Head normocephalic, atraumatic.  Extraocular muscles are intact.  NECK:  Supple, nontender, no palpable thyromegaly or mass.  CARDIOVASCULAR:  Normal S1 and S2.  LUNGS:  Clear bilaterally.  ABDOMEN:  Distended with hypoactive bowel sounds.  There is no rigidity,  no rebound or guarding.  LOWER  EXTREMITIES:  No edema.  His peripheral pulses are symmetrical and  palpable.  He has no calf tenderness.  RECTAL:  As described above, revealed minimal stool in the rectal vault.  His was a brown Hemoccult positive stool.  He is wearing an adult  diaper.  He is able to move his left lower extremity with +3-4/5  strength, in the right lower +1-2/5.  NEUROLOGIC:  He is slightly dysarthric, otherwise he is nontoxic in  appearance.  He has poor dentition and halitosis.   LABORATORY DATA:  Sodium 142, potassium 4, BUN 32, creatinine 1.3,  glucose 149.  WBC 11.1, hemoglobin 17, platelet count of 212, MCV of 89.  INR was 4.4.  AST/ALT 19/25, albumin 3.6, total bilirubin 1.4.   X-RAY:  This revealed small-bowel obstruction and hemoccult positive  stool as well.   ASSESSMENT:  1. Small-bowel obstruction.  2. Coffee-ground emesis/upper gastrointestinal bleed, currently      hemodynamically stable.  3. Supratherapeutic INR.  4. Cerebrovascular disease.  5. Hypertension.  6. Tobacco abuse.  7. Hypothyroidism.  8. Hyperlipidemia.   PLAN:  At this time, will implement bowel rest, provide IV fluids, place  NG tube to low intermittent suction, consultation general surgery as  well as gastroenterology, administer low dose of vitamin K and follow  his H&H and a PT/INR.  Support him  hemodynamically if required.  Will  follow him initially on the telemetry floor and follow his H&Hs  periodically.      Hettie Holstein, D.O.  Electronically Signed     ESS/MEDQ  D:  12/22/2006  T:  12/23/2006  Job:  [id number]   cc:   Maxwell Caul, M.D.

## 2010-06-17 NOTE — Op Note (Signed)
Brett Fletcher, Brett Fletcher               ACCOUNT NO.:  [id number]   MEDICAL RECORD NO.:  [id number]          PATIENT TYPE:  AMB   LOCATION:  ENDO                         FACILITY:  [facility]   PHYSICIAN:  Shirley Friar, MDDATE OF BIRTH:  Jun 29, 1950   DATE OF PROCEDURE:  DATE OF DISCHARGE:                               OPERATIVE REPORT   INDICATION:  Need for a PEG tube removal.  Unable to remove with  retraction on the abdominal wall.  Therefore, upper endoscopy being done  to evaluate what was going on intraluminally with the PEG tube.   MEDICATIONS:  Fentanyl 75 mcg IV, Versed 7.5 mg IV, Cetacaine spray, and  EMLA cream to the PEG tube site.   SURGEON:  Shirley Friar, MD   FINDINGS:  The endoscope was inserted through oropharynx and esophagus  was intubated.  In the stomach, the internal bolster was noted to be in  place without any ulceration or bleeding.  Endoscope was advanced  through this area into the second portion of the duodenum which was  normal in appearance.  Endoscope was withdrawn back to the stomach.  Retroflexion was done, which revealed normal proximal stomach.  Retraction was then again tried on the abdominal wall, but the bolster  would not collapse.  The PEG tube was then cut as close to the bolster  as possible and a Lucina Mellow net was used to retrieve the bolster and pull it  out with the endoscope.   ASSESSMENT:  Successful percutaneous endoscopic gastrostomy tube removal  with using a Roth net during upper endoscopy.   PLAN:  1. Resume Coumadin in 5 days at previous dose.  2. Place daily gauze pads over previous PEG tube site.      Shirley Friar, MD  Electronically Signed     VCS/MEDQ  D:  09/09/2007  T:  09/09/2007  Job:  [id number]   cc:   Maxwell Caul, M.D.

## 2010-06-17 NOTE — Op Note (Signed)
Brett Fletcher, Brett Fletcher NO.:  [id number]   MEDICAL RECORD NO.:  [id number]          PATIENT TYPE:  INP   LOCATION:  2306                         FACILITY:  [facility]   PHYSICIAN:  Gabrielle Dare. Janee Morn, M.D.DATE OF BIRTH:  1950/10/22   DATE OF PROCEDURE:  12/24/2006  DATE OF DISCHARGE:                               OPERATIVE REPORT   PREOPERATIVE DIAGNOSES:  1. Small-bowel obstruction.  2. Extremely dilated colon.   POSTOPERATIVE DIAGNOSIS:  Sigmoid volvulus.   PROCEDURES:  1. Exploratory laparotomy.  2. Sigmoid colectomy.  3. Colostomy with Hartmann's pouch   SURGEON:  Gabrielle Dare. Janee Morn, MD.   ASSISTANT:  Letha Cape, PA   ANESTHESIA:  General.   HISTORY OF PRESENT ILLNESS:  Mr. Demery is a 60 year old gentleman who  has been a resident of [facility] status post a stroke in  2004.  This led to a right-sided hemiplegia with significant aphasia.  He has been wheelchair-bound with episodes of nausea and vomiting,  worsening around the days prior to admission.  It seems he has been  having trouble with his bowels for several weeks.  He was admitted with  a bowel obstruction and over-anticoagulation with an INR of 5.4 that was  corrected, and NG tube decompression has been done; however, his bowel  obstructive pattern on plain films and CT scan has not improved.  He has  also had increasing dilatation of colon, and we are bringing him to the  operating room this morning for exploration.   PROCEDURE IN DETAIL:  Informed consent was obtained from the patient's  mother.  He has received intravenous antibiotics.  He was brought to the  operating room.  General anesthesia was administered by the anesthesia  staff.  Anesthesia also placed a central line.  His abdomen was prepped  and draped in sterile fashion.  A midline incision was made.  Subcutaneous tissues were dissected down, revealing anterior fascia.  This was divided sharply.  The peritoneal  cavity was entered under  direct vision without difficulty.  The fascia was opened the length of  the incision.  Exploration revealed an extremely dilated and partially  twisted sigmoid colon consistent with an incomplete volvulus.  The  sigmoid was completely viable and there was no perforation whatsoever.  However, it was obviously defunctionalized and extremely dilated.  The  remainder of the sigmoid colon and left colon, transverse colon and  right colon were all viable and did have some mild retained stool.  Small bowel was moderately dilated, especially more proximally, but it  was run from the ligament of Treitz back to the terminal ileum and there  was no evidence of obstruction.  There was a lot of air likely secondary  to adynamic ileus due to his chronic colon problems.  Some enteric  contents and air was milked back up proximally and suctioned out via his  nasogastric tube.  Attention was then redirected to the problem area,  being the sigmoid colon distal portion where it tapered back down to  normal, and the rectosigmoid was divided with a GIA-75 stapler.  We then  divided a section of nondilated and peristalsing area of the very  proximal sigmoid with the GIA-75 stapler.  The mesentery in between was  then gradually taken down with the LigaSure, achieving excellent  hemostasis.  We stayed well above the location of the ureter.  The  specimen was passed off.  It was not opened whatsoever and so there was  no contamination.  We then ensured hemostasis along the mesentery.  The  distal left and remainder of the sigmoid colon were mobilized somewhat  from the lateral peritoneal attachments along the line of Toldt in order  to bring out our colostomy.  Site for colostomy was then chosen in the  left lower quadrant.  Circular skin incision was made while holding the  fascia medially with a Kocher clamp.  The fat was cored out at the  ostomy site and a cruciate incision was made in  the fascia.  The muscles  were split gently and this opening was enlarged to the easily fit two  fingers.  The colon was then brought out nicely through this ostomy  site.  It was positioned and then tacked up from the inside to the  anterior abdominal wall with several interrupted 2-0 silk sutures.  The  abdomen was copiously irrigated with liters of warm saline.  Irrigation  fluid returned clear.  The mesentery was rechecked and there was no  bleeding.  The bowel was returned to anatomic position and remained  healthy in appearance.  The omentum was brought back over the intestine  and the abdomen was closed.  The fascia was closed with two lengths of  #1 looped PDS, one from each end of the incision and tied in the middle.  Subcutaneous tissues were irrigated.  The skin was closed with staples.  The ostomy was then matured with interrupted 3-0 Vicryl sutures and an  ostomy appliance was placed.  Sterile dressings were placed.  Sponge,  needle and instrument counts were all correct.  The patient tolerated  the procedure without apparent complication, remained intubated and was  taken back to the surgical intensive care unit in critical but stable  condition.      Gabrielle Dare Janee Morn, M.D.  Electronically Signed     BET/MEDQ  D:  12/24/2006  T:  12/25/2006  Job:  [id number]   cc:   [employer] [facility] Team  Oretha Milch, MD

## 2010-06-20 NOTE — Discharge Summary (Signed)
[name], [name]                         ACCOUNT NO.:  [id number]   MEDICAL RECORD NO.:  [id number]                   PATIENT TYPE:  INP   LOCATION:  3031                                 FACILITY:  [facility]   PHYSICIAN:  Fleet Contras, M.D.                 DATE OF BIRTH:  11-05-1950   DATE OF ADMISSION:  DATE OF DISCHARGE:  05/15/2002                                 DISCHARGE SUMMARY   ADMISSION DIAGNOSIS:  Acute cerebral infarction.   FINAL DIAGNOSES:  1. 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  2. Patent foramen ovale.  3. Systemic hypertension.  4. Chronic tobacco abuse.  5. Cervical spondylosis and stenosis, status post anterior diskectomy and C5-     C6 fusion.  6. Status post hematuria.  7. Status post right lower lobe aspiration pneumonia.   PRESENTATION:  [name] is a 60 year old Caucasian gentleman with a  history of hypertension, chronic cigarette smoking who was admitted via the  emergency room on 03/25/2002, with a history of sudden onset right-sided  weakness, slurring of speech, after he was found by his son lying no the  floor in his bedroom.  He had recently had an anterior cervical diskectomy  and fusion of C5-C6 performed in February of 2004, by Dr. Otelia Sergeant.  The  patient was recovering from this surgery.  He denied any previous focal  weakness, dizziness or slurring of the speech prior to this episode.  He has  had no previous strokes.  His blood pressure has been fairly well-  controlled, but he continues to smoke cigarettes and drink alcohol heavily  prior to this admission.   On evaluation in the emergency room, the patient was found to be alert, but  aphasic.  Blood pressure was 138/94; he was wearing a cervical collar, and  he had superficial abrasions of both legs presumably from the fall.  The  neurological examination showed reduced gag reflex  with right hemiplegia  with Babinski reflex on the right side.   General laboratory studies included a CT scan of the brain which showed  multifocal strokes of the left periventricular white matter and basal  ganglia with no evidence of any hemorrhage.   The patient was initially admitted to the intensive care unit of [facility] for further evaluation and appropriate therapy.   HOSPITAL COURSE:  On admission, the patient was started on an intravenous  infusion of heparin.  A neurology consultation was requested and orders for  MRI and MRA were also requested.  The patient was evaluated for speech and  swallowing, and an NG tube was placed for medications.  The patient was seen  by neurologist, Dr. Melvyn Novas, who reviewed his MRI and MRA scan.  This showed a large left middle cerebral artery hemorrhagic infarct.  It is  described as swelling and hemorrhagic infarction thought originally ischemic  cerebral infarct.  Carotid Doppler showed no evidence of internal carotid  artery stenosis.  There was a mild left SI carotid artery stenosis.  This  was highly suggestive of an embolic focus.  Due to the finding of hemorrhage  in the infarct, anticoagulation was discontinued.  A transthoracic  echocardiogram was performed, and this showed no evidence of intracardiac  embolus or thrombosis.  A transesophageal echocardiogram was also performed,  a patent foramen ovale with right to left shift.  This was thought to be the  source of the embolism.  The patient was, therefore, started on full  anticoagulation on a long-term basis.  The patient was initially on Lovenox  subq. for DVT prophylaxis.  He was also started on Coumadin, and this was  through out pharmacy to keep the INR between 2-3.  Meanwhile, the patient  continued to receive physical therapy, speech therapy, and swallowing  evaluation.  The patient had a Heimlich tube placed for enteral feeding as  well as for  medications.  He remained aphasic with persistent spastic right  hemiparesis.  On 04/29/02, a chest x-ray was performed for a low grade fever  that showed right lower lobe atelectasis.  The patient was started on  antibiotics initially with intravenous Zosyn.  This was later changed to  Tequin which he completed for 14 days, and then was discontinued.  His blood  pressure was controlled initially on intravenous Labetalol.  This was then  changed to oral medications as soon as he was able to tolerate enteral  feeding.  He had a swallowing evaluation done by [facility] that showed the patient  could tolerate oral feeding, but whether this was going to be sufficient to  sustain his nutrition and hydration was suspicious, but discussion about PEG  placement were discussed.  The patient was seen by Dr. Lindaann Slough  during his course of hospitalization, an urology visit.  The patient was not  very cooperative with physical therapy evaluation, and seemed to be  uninterested.  He was thought to be depressed, and he was started on Zoloft  25 mg once  day for a week, and Vistaril 15 mg once a day.  The patient was  able to tolerate a diet with honey-thick liquids with strict aspiration  precautions.  On 05/03/2002, the patient had an episode of hematuria.  This  was thought to be mainly due to trauma from his Foley catheter as well as  anticoagulation.  A urology consult was requested.  The patient had a CT  scan of the abdomen and pelvis which showed a lesion in the bladder which  was thought to be due to blood clot.  The patient had bedside flexible  cystoscopy which did not reveal any ____________ or lesion.  The Foley  catheter was replaced, and the patient started draining clear urine.  The  patient eventually pulled out his NG tube, and was able to tolerate a full  diet eating about 75-100% of each meal, as well as drinking honey-thick liquids.  Dependent tube was continued, and the patient was  able  to  tolerate honey-thick orange juice and other liquids as well as a full diet.  All intravenous fluids were also discontinued, and the patient was able to  maintain good hydration.  He remained afebrile.  Once, the antibiotics were  discontinued, he continued to receive Coumadin for anticoagulation, physical  therapy, speech therapy as well as occupational  therapy.  The patient was  recommended for inpatient rehabilitation.  The therapist suggested that he  would require about three months of  intensive therapy for any kind of  improvement.  It was difficult to find a skilled nursing facility that would  accept the patient in the area.  The case manager did make efforts to get a  skilled nursing facility in the [place] area.  This was  initially not acceptable by the family.  They were concerned about the  distance from their homes; but, eventually, the patient was accepted to  [facility] where the patient will be transferred for continued  therapy.  Today, the patient is lying in bed comfortably, not in acute  respiratory or fatal distress.  His vital signs shows a temperature of 98.6;  blood pressure 128/71; respiratory rate 16; pulse rate of 60; oxygen  saturation on room air is 95%.  He is alert, oriented, responsive to yes or  no to answers, he essential remains aphasic.  He is afebrile.  He is well-  hydrated.  He has no neck stiffness, no elevated JVD or cervical  lymphadenopathy.  His chest is clear to auscultation with heart sounds S1  and S2 heard with no murmur.  The abdomen is soft, nontender, no masses.  Bowel sounds are present.  The extremities show no edema or calf tenderness  or swelling. There are a few superficial abrasions of the legs which are  healing well.  The distal pulses are present.  CNS:  He is alert and  oriented.  He is aphasic with right spastic hemiplegia.   His laboratory data today showed INR of 3.2, white cell count  7.4,  hemoglobin 15.4, hematocrit 45.2, platelet count of 181,000.  Sodium is 136,  potassium 3.5, chloride 101, bicarbonate 228, BUN 14, creatinine 1.2, and a  glucose of 98.  AST 39, ALT 64, albumin 3.0, calcium 8.8, alkaline  phosphatase is 82.   PROCEDURES PERFORMED IN THE HOSPITAL:  1. Carotid Dopplers.  2. CT scan of the brain.  3. MRI scan of the brain.  4. Transthoracic echocardiogram.  5. Transesophageal echocardiogram.  6. Swallowing evaluation.    CONSULTATIONS:  1. Neurologist, Dr. Porfirio Mylar Dohmeier.  2. Urologist, Dr. Lindaann Slough.   ASSESSMENT:  [name] is a 60 year old Caucasian gentleman admitted  through the emergency room with a large left middle cerebral artery  territory cerebral infarct complicated by aphasia, dysphagia, and a right  spastic hemiplegia.  His hospital course was complicated by a right lower  lobe pneumonia which was treated, hematuria which was resolved secondary to Foley catheterization and anticoagulation.  He is continuing a full diet as  well as honey-thickened liquids which he is tolerating pretty well.   DISCHARGE MEDICATIONS:  1. Aspirin 325 mg once a day.  2. Nicotine patch 21 mg in 24 hours.  This should be tapered and     discontinued over a course of six weeks to three months.  3. Amlodipine 5 mg once a day.  4. Prevacid 30 mg once a day.  5. Zocor 20 mg once a day.  6. Zoloft 50 mg once a day.  7. Coumadin currently at 10 mg once a day.  8. Baclofen 10 mg t.i.d..  9. Tylenol 650 mg subq. q.6h. p.r.n.   FOLLOWUP:  Followup will be by the house staff at the nursing home.  Fleet Contras, M.D.    EA/MEDQ  D:  05/15/2002  T:  05/15/2002  Job:  [id number]

## 2010-06-20 NOTE — Consult Note (Signed)
Brett Fletcher, Brett Fletcher                         ACCOUNT NO.:  [id number]   MEDICAL RECORD NO.:  [id number]                   PATIENT TYPE:  INP   LOCATION:  2906                                 FACILITY:  [facility]   PHYSICIAN:  Melvyn Novas, M.D.               DATE OF BIRTH:  1950-04-15   DATE OF CONSULTATION:  04/24/2002  DATE OF DISCHARGE:                                   CONSULTATION   HISTORY OF PRESENT ILLNESS:  The patient is a 60 year old right-handed  Caucasian gentleman with a longstanding history of spinal stenosis,  surgically reviewed for cervical spinal stenosis just this February by Dr.  Otelia Sergeant and previously had been operated on lower spinal lesion.  He suffered  apparently and acute stroke and he was found responsive and then recovered  with hemiparesis from his loss of consciousness.  His son had found him on  the floor at the home where he lives with two of his sons.  The patient is  separated from his wife and a girlfriend is here at the bedside.  He  responded to light touch but had a garbled speech, right-sided weakness but  was unable to stand.  Positive urine and fecal incontinence.  He has also no  complaints of chest pain or shortness of breath but could not produce any  words.  He seems to respond to verbal stimuli and commands appropriately and  I, therefore, believe that he is comprehending fine.  The son endorsed that  the father is a heavy drinker of three quarts of beer a day and smokes about  two packs per day.  He has not been in withdrawal, however.   PAST MEDICAL HISTORY:  Hypertension, spinal stenosis, status post anterior  discectomy by Dr. Otelia Sergeant March 14, 2002.   FAMILY HISTORY:  The mother is described as healthy.  Father's history is  not known.   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  He has two sons.  He  continues to smoke two packs per day.  He  drinks three quarts of beer daily.   MEDICATIONS:  He is suppose to take Norvasc and hydrochlorothiazide at  unknown doses.  The son states he can bring the pills in.   ALLERGIES:  No known drug allergies.   He was placed on aspirin 325 mg daily.  Heparin IV, D5 mixed with normal  saline at 50% given at 75 mL per hour.  Labetalol was also given per drip.  The patient had a feeding tube placed after he choked on his own saliva and  has now rales and rhonchi.   PHYSICAL EXAMINATION:  VITAL SIGNS:  He is producing temperatures of around  100 F, blood pressure 145/75, pulse rate 98, respiratory rate 22.  He only  oxygenated about 94% on two liters of nasal cannula  and no mask.  HEENT:  Normocephalic and atraumatic.  Pupils are equal, round and reactive  to light and accommodation.  LUNGS:  Clear to auscultation.  He has no wheezing.  Decreased pulmonary  chest movement due to emphysema.  CARDIOVASCULAR:  The patient has been showing regular heartbeats with equal  formation sinus rhythm by EKG strip.  He has no elicited murmur.  ABDOMEN:  EXTREMITIES:  No clubbing, cyanosis, or edema.  Peripheral pulses for radial  and carotid are palpable.  NEUROLOGIC:  The patient is arousable, alert, able to understand but unable  to respond appropriately by verbal output.  Unable to write.  Sensation is  unable to test secondary to the speech.  He can, however, grimace and  gestures.  Motor exam shows left lower extremity weakness of 4-/5 but the  right is preserved.  On the right for the upper extremity is 0/5, for the  lower extremity is 1/5.  Contracted spasticity of the right lower extremity  status post spinal stenosis of the lumbar spine.  He is hyperreflexic  throughout at 3+, has upgoing Babinski on the right. Cerebellar testing:  The patient could perform finger-to-nose only with his left,  in his right  hand he almost no movement.  When I passively elevate his right hand, he was  able to  take my hand out of his palm with his left after I asked him to let  go.  The cranial nerve exam shows good _____ reflexes and extraocular  movements with a gaze preference to the left.  The patient can, however,  cross the midline.  Field evaluation visual was suspicious for peripheral  right-sided vision loss.  Tongue and uvula do not move midline but due to  the placement of the feeding tube it is hard to elicit a gag reflex.  There  is facial droop on the right which causes a decrease in the nasal labial  fold formation.  The forehead is not involved.  Sensory seems to be intact  in both facial parts.   A head CT showed multifocal ischemic changes with suspicion of 40 focal  subacute strokes, perhaps even embolic in nature.  MRI was obtained on April 24, 2002, this morning, showing atrophy, small vessel disease and old  infarct on the right parietal lobe.  Left maxillary sinus examination and  large left middle cerebral artery hemorrhagic infarct as described by  swelling and hemorrhagic conversion of an originally ischemic cerebral  infarct.  Interestingly, a carotid Doppler was obtained showing flow in both  internal carotid arteries and peak flow for the vertebral arteries making  cardioembolic event more likely.                                                Melvyn Novas, M.D.    CD/MEDQ  D:  04/24/2002  T:  04/25/2002  Job:  [id number]
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
## 2010-06-20 NOTE — Op Note (Signed)
   Brett Fletcher, Brett Fletcher                         ACCOUNT NO.:  [id number]   MEDICAL RECORD NO.:  [id number]                   PATIENT TYPE:  INP   LOCATION:  3031                                 FACILITY:  [facility]   PHYSICIAN:  Lindaann Slough, M.D.               DATE OF BIRTH:  16-Nov-1950   DATE OF PROCEDURE:  05/08/2002  DATE OF DISCHARGE:                                 OPERATIVE REPORT   PREOPERATIVE DIAGNOSIS:  Gross hematuria.   POSTOPERATIVE DIAGNOSIS:  Gross hematuria.   OPERATION PERFORMED:  Cystoscopy.   SURGEON:  Hilda Lias, M.D.   INDICATIONS FOR PROCEDURE:  The patient is a 60 year old male who was  admitted with a stroke.  He was started on anticoagulants.  He also had a  Foley catheter and he started having gross hematuria.  A CT scan of the  abdomen and pelvis showed normal kidneys and a filling defect in the bladder  that could be either a blood clot or bladder tumor.  He is scheduled today  for cystoscopy.   DESCRIPTION OF PROCEDURE:  After instillation of 2% Xylocaine jelly in the  urethra, a flexible cystoscope was inserted in the bladder.  The patient has  moderate prostatic hypertrophy.  The bladder mucosa is reddened.  There is  no stone or tumor in the bladder.  The ureteral orifices are in normal  position and shape with clear efflux.  The cystoscope was then removed.  A  #16 French Foley catheter was then inserted in the bladder.   The patient tolerated the procedure well.                                               Lindaann Slough, M.D.    MN/MEDQ  D:  05/08/2002  T:  05/08/2002  Job:  [id number]   cc:   Fleet Contras, M.D.  [address]  [place]  [place] [address]  Fax: [phone]

## 2010-06-20 NOTE — Consult Note (Signed)
   Brett Fletcher, Brett Fletcher                           ACCOUNT NO.:  [id number]   MEDICAL RECORD NO.:  [id number]                   PATIENT TYPE:  INP   LOCATION:                                       FACILITY:  [facility]   PHYSICIAN:  Lindaann Slough, M.D.               DATE OF BIRTH:  03/17/1950   DATE OF CONSULTATION:  05/04/2002  DATE OF DISCHARGE:                                   CONSULTATION   REASON FOR CONSULTATION:  Hematuria.   HISTORY OF PRESENT ILLNESS:  The patient is a 60 year old male who sustained  a stroke about two weeks ago.  He has had an indwelling Foley catheter and  has been on Lovenox.  About two days ago he started having worse hematuria.  The patient is aphasic, however, he understands commands.  He states that he  had hematuria before, but he states that he did not have any other voiding  symptoms.   SOCIAL HISTORY:  He was a heavy drinker and drank three quarts of beer  daily.  He has smoked about two packs a day.   MEDICATIONS:  1. Norvasc.  2. Hydrochlorothiazide.  3. Lovenox.  4. He was started today on Coumadin 7.5 mg.   PAST SURGICAL HISTORY:  Cervical laminectomy in February 2003.   PHYSICAL EXAMINATION:  ABDOMEN:  Protuberant.  He has no costovertebral  angle tenderness.  His kidneys are not palpable.  GU EXAM:  His bladder is not distended.  Penis is circumcised and meatus is  normal.  Scrotum is unremarkable.  The right and left testes are normal.  The right and left epididymis are within normal limits and both cords are  normal.  RECTAL EXAM:  Deferred.   LABORATORY DATA:  PSA at Dr. Albertina Parr office and it was reported as being  normal.   Urinalysis shows 0 to 2 WBC's and too numerous to count RBC's.  BUN 20,  creatinine 1.1.   IMPRESSION:  Gross hematuria.   The hematuria is probably secondary to anticoagulants and Foley catheter.   However, in view of the patient's long history of smoking we need to rule  out a kidney or bladder  tumor.    SUGGESTIONS:  CT scan of the abdomen and pelvis and cystoscopy.   Thank you and we will follow the patient with you.                                               Lindaann Slough, M.D.    MN/MEDQ  D:  05/04/2002  T:  05/06/2002  Job:  [id number]   cc:   Fleet Contras, M.D.  [address]  [place]  [place] [address]  Fax: [phone]

## 2010-10-30 LAB — PROTIME-INR
INR: 1.1
Prothrombin Time: 14.1

## 2010-10-30 LAB — CBC
HCT: 41.7
Hemoglobin: 14.2
MCHC: 34
MCV: 90.4
Platelets: 164
RBC: 4.61
RDW: 12.9
WBC: 10.3

## 2010-10-30 LAB — BASIC METABOLIC PANEL
BUN: 23
CO2: 26
Calcium: 9.4
Chloride: 106
Creatinine, Ser: 1.01
GFR calc Af Amer: >60
GFR calc non Af Amer: >60
Glucose, Bld: 95
Potassium: 3.9
Sodium: 140

## 2010-10-30 LAB — APTT: aPTT: 29

## 2010-11-07 LAB — CBC
MCHC: 33.7
MCV: 90.6
Platelets: 398
Platelets: 427 — ABNORMAL HIGH
RBC: 2.91 — ABNORMAL LOW
RBC: 3.21 — ABNORMAL LOW
RDW: 16.3 — ABNORMAL HIGH
WBC: 9.8

## 2010-11-10 LAB — CBC
HCT: 23.4 — ABNORMAL LOW
HCT: 24.2 — ABNORMAL LOW
HCT: 25.7 — ABNORMAL LOW
HCT: 26.1 — ABNORMAL LOW
HCT: 26.2 — ABNORMAL LOW
HCT: 26.4 — ABNORMAL LOW
HCT: 26.5 — ABNORMAL LOW
HCT: 27.1 — ABNORMAL LOW
Hemoglobin: 8 — ABNORMAL LOW
Hemoglobin: 8.8 — ABNORMAL LOW
Hemoglobin: 8.8 — ABNORMAL LOW
Hemoglobin: 9 — ABNORMAL LOW
Hemoglobin: 9.3 — ABNORMAL LOW
MCHC: 33.7
MCHC: 34
MCHC: 34.2
MCHC: 34.3
MCHC: 34.7
MCV: 87.2
MCV: 89
MCV: 90.5
MCV: 90.7
MCV: 90.8
Platelets: 502 — ABNORMAL HIGH
Platelets: 570 — ABNORMAL HIGH
Platelets: 597 — ABNORMAL HIGH
Platelets: 623 — ABNORMAL HIGH
Platelets: 636 — ABNORMAL HIGH
Platelets: 662 — ABNORMAL HIGH
Platelets: 729 — ABNORMAL HIGH
RBC: 2.84 — ABNORMAL LOW
RBC: 2.92 — ABNORMAL LOW
RBC: 3.01 — ABNORMAL LOW
RBC: 3.04 — ABNORMAL LOW
RBC: 3.05 — ABNORMAL LOW
RDW: 14.7
RDW: 14.9
RDW: 16.1 — ABNORMAL HIGH
RDW: 16.3 — ABNORMAL HIGH
RDW: 16.9 — ABNORMAL HIGH
WBC: 10.9 — ABNORMAL HIGH
WBC: 10.9 — ABNORMAL HIGH
WBC: 11.4 — ABNORMAL HIGH
WBC: 11.6 — ABNORMAL HIGH
WBC: 12.3 — ABNORMAL HIGH
WBC: 12.4 — ABNORMAL HIGH
WBC: 13.1 — ABNORMAL HIGH
WBC: 15.8 — ABNORMAL HIGH

## 2010-11-10 LAB — CULTURE, RESPIRATORY W GRAM STAIN

## 2010-11-10 LAB — VITAMIN B12: Vitamin B-12: 1634 — ABNORMAL HIGH (ref 211–911)

## 2010-11-10 LAB — COMPREHENSIVE METABOLIC PANEL
ALT: 45
ALT: 49
AST: 127 — ABNORMAL HIGH
AST: 32
AST: 32
AST: 37
AST: 83 — ABNORMAL HIGH
Albumin: 1 — ABNORMAL LOW
Albumin: 1.9 — ABNORMAL LOW
Albumin: 1.9 — ABNORMAL LOW
Alkaline Phosphatase: 138 — ABNORMAL HIGH
Alkaline Phosphatase: 145 — ABNORMAL HIGH
Alkaline Phosphatase: 159 — ABNORMAL HIGH
Alkaline Phosphatase: 179 — ABNORMAL HIGH
Alkaline Phosphatase: 97
BUN: 12
BUN: 19
BUN: 28 — ABNORMAL HIGH
BUN: 29 — ABNORMAL HIGH
CO2: 29
CO2: 29
CO2: 29
CO2: 31
Calcium: 8 — ABNORMAL LOW
Calcium: 8.2 — ABNORMAL LOW
Chloride: 102
Chloride: 104
Chloride: 105
Chloride: 106
Chloride: 99
Creatinine, Ser: 0.67
Creatinine, Ser: 0.74
Creatinine, Ser: 1.43
GFR calc Af Amer: >60
GFR calc Af Amer: >60
GFR calc Af Amer: >60
GFR calc non Af Amer: 51 — ABNORMAL LOW
GFR calc non Af Amer: >60
GFR calc non Af Amer: >60
GFR calc non Af Amer: >60
Glucose, Bld: 106 — ABNORMAL HIGH
Glucose, Bld: 123 — ABNORMAL HIGH
Potassium: 3.4 — ABNORMAL LOW
Potassium: 3.8
Potassium: 3.8
Potassium: 3.9
Potassium: 3.9
Sodium: 138
Sodium: 143
Total Bilirubin: 1.7 — ABNORMAL HIGH
Total Bilirubin: 1.7 — ABNORMAL HIGH
Total Bilirubin: 4.8 — ABNORMAL HIGH
Total Bilirubin: 6.1 — ABNORMAL HIGH
Total Protein: 6.3
Total Protein: 7.1

## 2010-11-10 LAB — POCT I-STAT 3, ART BLOOD GAS (G3+)
Bicarbonate: 31.6 — ABNORMAL HIGH
O2 Saturation: 98
Operator id: 205501
pCO2 arterial: 47.7 — ABNORMAL HIGH
pCO2 arterial: 49.4 — ABNORMAL HIGH
pH, Arterial: 7.422
pH, Arterial: 7.437
pO2, Arterial: 104 — ABNORMAL HIGH
pO2, Arterial: 112 — ABNORMAL HIGH

## 2010-11-10 LAB — DIFFERENTIAL
Basophils Absolute: 0.1
Basophils Relative: 0
Eosinophils Absolute: 0.4
Eosinophils Relative: 3
Lymphocytes Relative: 11 — ABNORMAL LOW
Lymphocytes Relative: 12
Lymphs Abs: 1.7
Monocytes Absolute: 0.8
Monocytes Relative: 1 — ABNORMAL LOW
Monocytes Relative: 6
Neutro Abs: 10.3 — ABNORMAL HIGH
Neutrophils Relative %: 86 — ABNORMAL HIGH

## 2010-11-10 LAB — BASIC METABOLIC PANEL
BUN: 24 — ABNORMAL HIGH
BUN: 24 — ABNORMAL HIGH
BUN: 29 — ABNORMAL HIGH
CO2: 27
Calcium: 7.7 — ABNORMAL LOW
Calcium: 8 — ABNORMAL LOW
Chloride: 108
GFR calc Af Amer: >60
GFR calc non Af Amer: >60
GFR calc non Af Amer: >60
GFR calc non Af Amer: >60
GFR calc non Af Amer: >60
Glucose, Bld: 115 — ABNORMAL HIGH
Glucose, Bld: 125 — ABNORMAL HIGH
Glucose, Bld: 134 — ABNORMAL HIGH
Potassium: 3.7
Potassium: 4.1
Potassium: 4.1
Potassium: 4.1
Potassium: 4.1
Sodium: 141
Sodium: 142
Sodium: 142

## 2010-11-10 LAB — RETICULOCYTES
RBC.: 2.77 — ABNORMAL LOW
RBC.: 3.01 — ABNORMAL LOW
Retic Count, Absolute: 58.2
Retic Count, Absolute: 78.3
Retic Ct Pct: 2.6

## 2010-11-10 LAB — CULTURE, BLOOD (ROUTINE X 2): Culture: NO GROWTH

## 2010-11-10 LAB — URINE CULTURE
Colony Count: NO GROWTH
Culture: NO GROWTH

## 2010-11-10 LAB — URINALYSIS, ROUTINE W REFLEX MICROSCOPIC
Glucose, UA: NEGATIVE
Glucose, UA: NEGATIVE
Hgb urine dipstick: NEGATIVE
Ketones, ur: NEGATIVE
Ketones, ur: NEGATIVE
Leukocytes, UA: NEGATIVE
Nitrite: NEGATIVE
Protein, ur: 30 — AB
Protein, ur: 30 — AB
Urobilinogen, UA: 1
pH: 6

## 2010-11-10 LAB — FOLATE: Folate: 12.6

## 2010-11-10 LAB — MAGNESIUM
Magnesium: 2.1
Magnesium: 2.2

## 2010-11-10 LAB — T3: T3, Total: 92.2 (ref 80.0–204.0)

## 2010-11-10 LAB — PREALBUMIN: Prealbumin: 3.7 — ABNORMAL LOW

## 2010-11-10 LAB — CHOLESTEROL, TOTAL: Cholesterol: 73

## 2010-11-10 LAB — B-NATRIURETIC PEPTIDE (CONVERTED LAB): Pro B Natriuretic peptide (BNP): 118 — ABNORMAL HIGH

## 2010-11-10 LAB — PHOSPHORUS
Phosphorus: 3.7
Phosphorus: 4

## 2010-11-10 LAB — URINE MICROSCOPIC-ADD ON

## 2010-11-10 LAB — IRON AND TIBC

## 2010-11-10 LAB — AMYLASE: Amylase: 78

## 2010-11-10 LAB — TRIGLYCERIDES: Triglycerides: 334 — ABNORMAL HIGH

## 2010-11-11 LAB — COMPREHENSIVE METABOLIC PANEL
ALT: 54 — ABNORMAL HIGH
ALT: 55 — ABNORMAL HIGH
ALT: 72 — ABNORMAL HIGH
ALT: 80 — ABNORMAL HIGH
AST: 124 — ABNORMAL HIGH
AST: 75 — ABNORMAL HIGH
AST: 78 — ABNORMAL HIGH
Albumin: <1 — ABNORMAL LOW
Albumin: <1 — ABNORMAL LOW
Albumin: <1 — ABNORMAL LOW
Alkaline Phosphatase: 101
Alkaline Phosphatase: 105
Alkaline Phosphatase: 80
Alkaline Phosphatase: 84
BUN: 22
BUN: 26 — ABNORMAL HIGH
CO2: 25
CO2: 25
CO2: 26
CO2: 26
Calcium: 7.4 — ABNORMAL LOW
Chloride: 102
Chloride: 103
Chloride: 108
Chloride: 109
Creatinine, Ser: 1.06
Creatinine, Ser: 1.43
Creatinine, Ser: 1.55 — ABNORMAL HIGH
GFR calc Af Amer: 56 — ABNORMAL LOW
GFR calc Af Amer: >60
GFR calc Af Amer: >60
GFR calc non Af Amer: 47 — ABNORMAL LOW
GFR calc non Af Amer: 55 — ABNORMAL LOW
GFR calc non Af Amer: 60 — ABNORMAL LOW
Glucose, Bld: 109 — ABNORMAL HIGH
Glucose, Bld: 115 — ABNORMAL HIGH
Glucose, Bld: 146 — ABNORMAL HIGH
Potassium: 3.8
Potassium: 3.9
Sodium: 133 — ABNORMAL LOW
Sodium: 137
Sodium: 137
Sodium: 139
Total Bilirubin: 5.6 — ABNORMAL HIGH
Total Bilirubin: 5.9 — ABNORMAL HIGH
Total Bilirubin: 6.2 — ABNORMAL HIGH
Total Protein: 5.9 — ABNORMAL LOW

## 2010-11-11 LAB — CBC
HCT: 23.2 — ABNORMAL LOW
HCT: 24.2 — ABNORMAL LOW
HCT: 28.5 — ABNORMAL LOW
HCT: 31.9 — ABNORMAL LOW
HCT: 33.7 — ABNORMAL LOW
HCT: 44.9
Hemoglobin: 10.9 — ABNORMAL LOW
Hemoglobin: 11.5 — ABNORMAL LOW
Hemoglobin: 13.1
Hemoglobin: 8 — ABNORMAL LOW
Hemoglobin: 8.2 — ABNORMAL LOW
Hemoglobin: 9.8 — ABNORMAL LOW
MCHC: 33.6
MCHC: 33.8
MCHC: 34.3
MCHC: 34.5
MCV: 88.1
MCV: 88.1
MCV: 88.2
MCV: 88.5
MCV: 88.9
MCV: 89
MCV: 89.3
MCV: 90.1
Platelets: 194
Platelets: 207
Platelets: 212
Platelets: 227
Platelets: 370
Platelets: 442 — ABNORMAL HIGH
RBC: 2.72 — ABNORMAL LOW
RBC: 2.96 — ABNORMAL LOW
RBC: 3.22 — ABNORMAL LOW
RBC: 3.48 — ABNORMAL LOW
RBC: 3.6 — ABNORMAL LOW
RBC: 3.83 — ABNORMAL LOW
RBC: 4.35
RBC: 5.31
RDW: 13.3
RDW: 13.8
RDW: 14.6
RDW: 14.9
RDW: 14.9
WBC: 11.1 — ABNORMAL HIGH
WBC: 14.2 — ABNORMAL HIGH
WBC: 17.5 — ABNORMAL HIGH
WBC: 18.6 — ABNORMAL HIGH
WBC: 19.1 — ABNORMAL HIGH
WBC: 23.5 — ABNORMAL HIGH
WBC: 23.7 — ABNORMAL HIGH
WBC: 26.3 — ABNORMAL HIGH
WBC: 7.3

## 2010-11-11 LAB — DIFFERENTIAL
Basophils Absolute: 0
Basophils Absolute: 0.1
Basophils Relative: 0
Basophils Relative: 0
Basophils Relative: 0
Basophils Relative: 0
Basophils Relative: 1
Eosinophils Absolute: 0.1 — ABNORMAL LOW
Eosinophils Absolute: 0.3
Eosinophils Absolute: 0.3
Eosinophils Relative: 2
Lymphocytes Relative: 6 — ABNORMAL LOW
Lymphocytes Relative: 9 — ABNORMAL LOW
Monocytes Relative: 1 — ABNORMAL LOW
Monocytes Relative: 4
Neutro Abs: 14.6 — ABNORMAL HIGH
Neutro Abs: 9.1 — ABNORMAL HIGH
Neutrophils Relative %: 82 — ABNORMAL HIGH
Neutrophils Relative %: 84 — ABNORMAL HIGH
Neutrophils Relative %: 85 — ABNORMAL HIGH
Neutrophils Relative %: 89 — ABNORMAL HIGH
Neutrophils Relative %: 91 — ABNORMAL HIGH

## 2010-11-11 LAB — MISCELLANEOUS TEST: Miscellaneous Test Results: 1.4

## 2010-11-11 LAB — BASIC METABOLIC PANEL
BUN: 24 — ABNORMAL HIGH
BUN: 39 — ABNORMAL HIGH
Calcium: 7.6 — ABNORMAL LOW
Calcium: 7.8 — ABNORMAL LOW
Chloride: 101
Chloride: 105
Chloride: 107
Creatinine, Ser: 1.22
Creatinine, Ser: 1.23
Creatinine, Ser: 1.48
GFR calc Af Amer: 59 — ABNORMAL LOW
GFR calc Af Amer: >60
GFR calc Af Amer: >60
GFR calc Af Amer: >60
GFR calc non Af Amer: 49 — ABNORMAL LOW
GFR calc non Af Amer: 51 — ABNORMAL LOW
GFR calc non Af Amer: 56 — ABNORMAL LOW
GFR calc non Af Amer: >60
Glucose, Bld: 121 — ABNORMAL HIGH
Glucose, Bld: 124 — ABNORMAL HIGH
Potassium: 3.7
Potassium: 3.8
Potassium: 3.9
Potassium: 4.1
Potassium: 4.1
Sodium: 137
Sodium: 138
Sodium: 139
Sodium: 139

## 2010-11-11 LAB — POCT I-STAT 3, ART BLOOD GAS (G3+)
Acid-Base Excess: 1
Acid-Base Excess: 4 — ABNORMAL HIGH
Acid-base deficit: 2
Bicarbonate: 23.3
Bicarbonate: 23.3
Bicarbonate: 25.3 — ABNORMAL HIGH
Bicarbonate: 29.2 — ABNORMAL HIGH
O2 Saturation: 91
O2 Saturation: 93
O2 Saturation: 94
Operator id: 205501
Operator id: 244901
Operator id: 270211
Operator id: 274841
Operator id: 277551
Operator id: 287011
Operator id: 296031
Patient temperature: 100.1
Patient temperature: 100.5
Patient temperature: 98.6
TCO2: 24
TCO2: 28
TCO2: 31
pCO2 arterial: 38.3
pCO2 arterial: 38.6
pCO2 arterial: 39.2
pCO2 arterial: 42.8
pCO2 arterial: 44
pCO2 arterial: 45.1 — ABNORMAL HIGH
pCO2 arterial: 45.3 — ABNORMAL HIGH
pCO2 arterial: 48.9 — ABNORMAL HIGH
pH, Arterial: 7.348 — ABNORMAL LOW
pH, Arterial: 7.349 — ABNORMAL LOW
pH, Arterial: 7.385
pH, Arterial: 7.386
pH, Arterial: 7.399
pH, Arterial: 7.41
pH, Arterial: 7.418
pO2, Arterial: 63 — ABNORMAL LOW
pO2, Arterial: 66 — ABNORMAL LOW
pO2, Arterial: 67 — ABNORMAL LOW
pO2, Arterial: 70 — ABNORMAL LOW
pO2, Arterial: 78 — ABNORMAL LOW
pO2, Arterial: 80

## 2010-11-11 LAB — CULTURE, BLOOD (ROUTINE X 2): Culture: NO GROWTH

## 2010-11-11 LAB — URINALYSIS, ROUTINE W REFLEX MICROSCOPIC
Ketones, ur: 15 — AB
Ketones, ur: 15 — AB
Nitrite: POSITIVE — AB
Protein, ur: 30 — AB
Specific Gravity, Urine: 1.024
Urobilinogen, UA: 1
Urobilinogen, UA: 1
pH: 5.5

## 2010-11-11 LAB — APTT: aPTT: 72 — ABNORMAL HIGH

## 2010-11-11 LAB — PROTEIN, BODY FLUID: Total protein, fluid: <1

## 2010-11-11 LAB — BLOOD GAS, ARTERIAL
Acid-base deficit: 0
Bicarbonate: 23.7
O2 Saturation: 88
TCO2: 24.8
pO2, Arterial: 51 — ABNORMAL LOW

## 2010-11-11 LAB — HEPATIC FUNCTION PANEL
AST: 19
Albumin: 3.6
Total Protein: 7.4

## 2010-11-11 LAB — CHOLESTEROL, TOTAL: Cholesterol: 14

## 2010-11-11 LAB — GLUCOSE, SEROUS FLUID: Glucose, Fluid: 106

## 2010-11-11 LAB — PROTIME-INR
INR: 1.3
INR: 1.4
INR: 1.5
INR: 1.6 — ABNORMAL HIGH
INR: 2.2 — ABNORMAL HIGH
INR: 4.4 — ABNORMAL HIGH
Prothrombin Time: 16.6 — ABNORMAL HIGH
Prothrombin Time: 16.7 — ABNORMAL HIGH
Prothrombin Time: 17.2 — ABNORMAL HIGH
Prothrombin Time: 18.4 — ABNORMAL HIGH
Prothrombin Time: 19.1 — ABNORMAL HIGH
Prothrombin Time: 25 — ABNORMAL HIGH
Prothrombin Time: 27.5 — ABNORMAL HIGH
Prothrombin Time: 43.8 — ABNORMAL HIGH

## 2010-11-11 LAB — I-STAT 8, (EC8 V) (CONVERTED LAB)
Bicarbonate: 31.2 — ABNORMAL HIGH
Glucose, Bld: 149 — ABNORMAL HIGH
TCO2: 32
pCO2, Ven: 38.9 — ABNORMAL LOW
pH, Ven: 7.513 — ABNORMAL HIGH

## 2010-11-11 LAB — MAGNESIUM
Magnesium: 2.3
Magnesium: 2.3
Magnesium: 2.4

## 2010-11-11 LAB — HEMOGLOBIN A1C
Hgb A1c MFr Bld: 5.1
Mean Plasma Glucose: 104

## 2010-11-11 LAB — PHOSPHORUS
Phosphorus: 2.8
Phosphorus: 4
Phosphorus: 4.9 — ABNORMAL HIGH

## 2010-11-11 LAB — CULTURE, RESPIRATORY W GRAM STAIN

## 2010-11-11 LAB — HEMOGLOBIN AND HEMATOCRIT, BLOOD: Hemoglobin: 16.4

## 2010-11-11 LAB — OCCULT BLOOD X 1 CARD TO LAB, STOOL
Fecal Occult Bld: NEGATIVE
Fecal Occult Bld: POSITIVE

## 2010-11-11 LAB — RETICULOCYTES
RBC.: 3.62 — ABNORMAL LOW
RBC.: 3.92 — ABNORMAL LOW
Retic Ct Pct: 1.3

## 2010-11-11 LAB — VANCOMYCIN, TROUGH: Vancomycin Tr: 27.1

## 2010-11-11 LAB — CULTURE, BAL-QUANTITATIVE W GRAM STAIN
Colony Count: 10000
Culture: NO GROWTH

## 2010-11-11 LAB — DIC (DISSEMINATED INTRAVASCULAR COAGULATION)PANEL
D-Dimer, Quant: 3.74 — ABNORMAL HIGH
Platelets: 276
Smear Review: NONE SEEN

## 2010-11-11 LAB — URINE MICROSCOPIC-ADD ON

## 2010-11-11 LAB — PH, BODY FLUID: pH, Fluid: 7.61

## 2010-11-11 LAB — CROSSMATCH: Antibody Screen: NEGATIVE

## 2010-11-11 LAB — BODY FLUID CELL COUNT WITH DIFFERENTIAL
Lymphs, Fluid: 30
Monocyte-Macrophage-Serous Fluid: 10 — ABNORMAL LOW
Neutrophil Count, Fluid: 60 — ABNORMAL HIGH
Total Nucleated Cell Count, Fluid: 610

## 2010-11-11 LAB — CATH TIP CULTURE: Culture: NO GROWTH

## 2010-11-11 LAB — PATHOLOGIST SMEAR REVIEW

## 2010-11-11 LAB — POCT I-STAT 4, (NA,K, GLUC, HGB,HCT)
Glucose, Bld: 119 — ABNORMAL HIGH
Operator id: 274841
Potassium: 3.9
Sodium: 138

## 2010-11-11 LAB — TRIGLYCERIDES: Triglycerides: 356 — ABNORMAL HIGH

## 2010-11-11 LAB — CHOLESTEROL, BODY FLUID: Cholesterol, Fluid: 10

## 2010-11-11 LAB — LACTATE DEHYDROGENASE
LDH: 221
LDH: 253 — ABNORMAL HIGH

## 2010-11-11 LAB — GLUCOSE, RANDOM: Glucose, Bld: 114 — ABNORMAL HIGH

## 2010-11-11 LAB — POCT I-STAT CREATININE: Operator id: 288331

## 2010-11-11 LAB — GASTRIC OCCULT BLOOD (1-CARD TO LAB): Occult Blood, Gastric: POSITIVE — AB

## 2010-11-11 LAB — LACTIC ACID, PLASMA: Lactic Acid, Venous: 1.7

## 2010-11-11 LAB — PREALBUMIN: Prealbumin: 1.9 — ABNORMAL LOW

## 2012-11-25 ENCOUNTER — Encounter: Payer: Self-pay | Admitting: Vascular Surgery

## 2012-12-21 ENCOUNTER — Encounter: Payer: Self-pay | Admitting: Vascular Surgery

## 2012-12-22 ENCOUNTER — Encounter: Payer: Self-pay | Admitting: Vascular Surgery

## 2012-12-22 ENCOUNTER — Ambulatory Visit (INDEPENDENT_AMBULATORY_CARE_PROVIDER_SITE_OTHER): Payer: PRIVATE HEALTH INSURANCE | Admitting: Vascular Surgery

## 2012-12-22 VITALS — BP 126/51 | HR 64 | Ht 72.0 in | Wt 185.0 lb

## 2012-12-22 DIAGNOSIS — I739 Peripheral vascular disease, unspecified: Secondary | ICD-10-CM

## 2012-12-22 DIAGNOSIS — R609 Edema, unspecified: Secondary | ICD-10-CM

## 2012-12-22 NOTE — Progress Notes (Signed)
[facility] HISTORY AND PHYSICAL   History of Present Illness:  Patient is a 62 y.o. year old male who presents for evaluation of lower extremity swelling and a left dorsal foot ulcer. The ulcer apparently is a recent development. The patient's son was at the office visit today and said he only recently seen this. He does not on how it started. The patient has a chronic leg swelling. 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 He is wheelchair-bound. 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 He currently smokes electronic cigarettes and does not express any interest in trying to quit. I reviewed a previous lower extremity venous duplex right upper extremity venous duplex and bilateral lower extremity arterial Doppler from [facility] dated 11/10/2012. This showed no evidence of DVT in the lower or upper extremities. ABIs were greater than 1 bilaterally. Waveforms were monophasic at all levels. 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  Other medical problems include hyperlipidemia and hypertension both of which are currently controlled.  Past Medical History  Diagnosis Date  . Hyperlipidemia   . Cataract   . Hypertension   . Stroke    Colostomy for unknown reason  Social History History  Substance Use Topics  . Smoking status: Current Some Day Smoker    Types: Cigarettes  . Smokeless tobacco: Not on file  . Alcohol Use: No    Family History No family history on file.  Allergies  Allergies  Allergen Reactions  . Doxycycline      Current Outpatient Prescriptions  Medication Sig Dispense Refill  . amLODipine (NORVASC) 5 MG tablet Take 5 mg by mouth daily.      Marland Kitchen atorvastatin (LIPITOR) 10 MG tablet Take 10 mg by mouth daily.      . baclofen (LIORESAL) 20 MG tablet Take 20 mg by mouth 4 (four) times daily.      . bisacodyl  (DULCOLAX) 5 MG EC tablet Take 5 mg by mouth daily as needed for constipation.      Marland Kitchen buPROPion (WELLBUTRIN SR) 150 MG 12 hr tablet Take 150 mg by mouth daily.      . cephALEXin (KEFLEX) 500 MG capsule Take 500 mg by mouth 2 (two) times daily.      . Chlorophyll 3-0.6 MG TABS Take 1 tablet by mouth 2 (two) times daily.      . Cholecalciferol (VITAMIN D3) 50000 UNITS CAPS Take 1 capsule by mouth every 30 (thirty) days.      Marland Kitchen escitalopram (LEXAPRO) 5 MG tablet Take 5 mg by mouth daily.      Marland Kitchen ezetimibe (ZETIA) 10 MG tablet Take 10 mg by mouth daily.      . fentaNYL (DURAGESIC - DOSED MCG/HR) 75 MCG/HR Place 1 patch onto the skin every 3 (three) days.      . furosemide (LASIX) 20 MG tablet Take 20 mg by mouth.      Marland Kitchen HYDROcodone-acetaminophen (VICODIN) 5-500 MG per tablet Take 1 tablet by mouth every 6 (six) hours as needed for pain.      . Ipratropium-Albuterol (COMBIVENT IN) Inhale 2 puffs into the lungs every 6 (six) hours as needed.      Marland Kitchen levothyroxine (SYNTHROID, LEVOTHROID) 75 MCG tablet Take 75 mcg by mouth daily before breakfast.      . lisinopril (PRINIVIL,ZESTRIL) 20 MG tablet Take 20 mg by mouth daily.      Marland Kitchen  LORAZEPAM PO Take 0.25 mg by mouth daily.      . metoCLOPramide (REGLAN) 5 MG tablet Take 5 mg by mouth 4 (four) times daily.      . Multiple Vitamins-Minerals (MULTIVITAMIN PO) Take 1 tablet by mouth daily.      . Omega-3 Fatty Acids (FISH OIL PO) Take 1 capsule by mouth 2 (two) times daily.      Marland Kitchen omeprazole (PRILOSEC) 20 MG capsule Take 20 mg by mouth 2 (two) times daily.      . potassium chloride (K-DUR,KLOR-CON) 10 MEQ tablet Take 10 mEq by mouth 2 (two) times daily. On Mon, Wed, Fri      . senna (SENOKOT) 8.6 MG tablet Take 1 tablet by mouth daily.      Marland Kitchen spironolactone (ALDACTONE) 25 MG tablet Take 25 mg by mouth daily.      Marland Kitchen torsemide (DEMADEX) 5 MG tablet Take 5 mg by mouth daily.      Marland Kitchen warfarin (COUMADIN) 2 MG tablet Take 2 mg by mouth daily.       No current  facility-administered medications for this visit.    ROS:   General:  No weight loss, Fever, chills  Cardiac: No recent episodes of chest pain/pressure, no shortness of breath at rest.  No shortness of breath with exertion.  Denies history of atrial fibrillation or irregular heartbeat  Hematologic:No history of hypercoagulable state.  He is on Coumadin  Urinary: [x ] chronic Kidney disease unknown the severity of his renal disease according to his son, [ ]  on HD - [ ]  MWF or [ ]  TTHS, [ ]  Burning with urination, [ ]  Frequent urination, [ ]  Difficulty urinating;   Skin: No rashes    Physical Examination  Filed Vitals:   12/22/12 1321  BP: 126/51  Pulse: 64  Height: 6' (1.829 m)  Weight: 185 lb (83.915 kg)  SpO2: 96%    Body mass index is 25.08 kg/(m^2).  General:  Alert and oriented, no acute distress, unable to answer some questions secondary to his achalasia, patient in wheelchair during office visit do to him being basically full assist HEENT: Normal Neck: No bruit or JVD Pulmonary: Clear to auscultation bilaterally Cardiac: Regular Rate and Rhythm without murmur Abdomen: Soft, non-tender, non-distended, obese Skin: No rash, 5 cm superficial skin abrasion left dorsal foot with some serous drainage and erythema Extremity Pulses:  Unable to palpate pulses in the lower extremities bilaterally secondary to the patient being in a wheelchair as well as 2-3+ edema Musculoskeletal: 2-3+ edema right upper extremity and bilateral lower extremity  Neurologic: Right hemiparesis  ASSESSMENT:  Lower extremity swelling most likely multifactorial. It may be some contribution from renal dysfunction. It is also probably related to dependency and his right hemiparesis. His legs are in a dependent position at all times. Other possible etiologies of leg swelling could be related to cardiac dysfunction or liver dysfunction. His venous duplex showed no DVT so I do not see a vascular reason for  his leg swelling. 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 However, he is too debilitated to consider any arterial intervention at this point. His ABIs suggest she should have enough perfusion to his lower extremities for spontaneous wound healing. If he develops worsening wounds over time his option would really only be an amputation of his leg. I do not believe he is currently at this point.   PLAN:  Local wound care left foot with Ace wraps bilateral lower extremity to  control swelling. Followup as needed.  Fabienne Bruns, MD [facility] Office: [phone] Pager: [phone]

## 2013-05-21 ENCOUNTER — Emergency Department (HOSPITAL_COMMUNITY): Payer: PRIVATE HEALTH INSURANCE

## 2013-05-21 ENCOUNTER — Encounter (HOSPITAL_COMMUNITY): Payer: Self-pay | Admitting: Emergency Medicine

## 2013-05-21 ENCOUNTER — Inpatient Hospital Stay (HOSPITAL_COMMUNITY)
Admission: EM | Admit: 2013-05-21 | Discharge: 2013-06-02 | DRG: 871 | Disposition: E | Payer: PRIVATE HEALTH INSURANCE | Attending: Critical Care Medicine | Admitting: Critical Care Medicine

## 2013-05-21 DIAGNOSIS — K219 Gastro-esophageal reflux disease without esophagitis: Secondary | ICD-10-CM | POA: Diagnosis present

## 2013-05-21 DIAGNOSIS — F17200 Nicotine dependence, unspecified, uncomplicated: Secondary | ICD-10-CM | POA: Diagnosis present

## 2013-05-21 DIAGNOSIS — R0602 Shortness of breath: Secondary | ICD-10-CM | POA: Diagnosis present

## 2013-05-21 DIAGNOSIS — Z881 Allergy status to other antibiotic agents status: Secondary | ICD-10-CM | POA: Diagnosis not present

## 2013-05-21 DIAGNOSIS — Z7901 Long term (current) use of anticoagulants: Secondary | ICD-10-CM

## 2013-05-21 DIAGNOSIS — F411 Generalized anxiety disorder: Secondary | ICD-10-CM | POA: Diagnosis present

## 2013-05-21 DIAGNOSIS — A419 Sepsis, unspecified organism: Secondary | ICD-10-CM | POA: Diagnosis present

## 2013-05-21 DIAGNOSIS — IMO0002 Reserved for concepts with insufficient information to code with codable children: Secondary | ICD-10-CM | POA: Diagnosis not present

## 2013-05-21 DIAGNOSIS — L89109 Pressure ulcer of unspecified part of back, unspecified stage: Secondary | ICD-10-CM | POA: Diagnosis present

## 2013-05-21 DIAGNOSIS — Z66 Do not resuscitate: Secondary | ICD-10-CM | POA: Diagnosis not present

## 2013-05-21 DIAGNOSIS — I69920 Aphasia following unspecified cerebrovascular disease: Secondary | ICD-10-CM | POA: Diagnosis not present

## 2013-05-21 DIAGNOSIS — E039 Hypothyroidism, unspecified: Secondary | ICD-10-CM | POA: Diagnosis present

## 2013-05-21 DIAGNOSIS — J80 Acute respiratory distress syndrome: Secondary | ICD-10-CM | POA: Diagnosis present

## 2013-05-21 DIAGNOSIS — Z515 Encounter for palliative care: Secondary | ICD-10-CM

## 2013-05-21 DIAGNOSIS — Z9049 Acquired absence of other specified parts of digestive tract: Secondary | ICD-10-CM

## 2013-05-21 DIAGNOSIS — D684 Acquired coagulation factor deficiency: Secondary | ICD-10-CM | POA: Diagnosis present

## 2013-05-21 DIAGNOSIS — Q211 Atrial septal defect: Secondary | ICD-10-CM | POA: Diagnosis not present

## 2013-05-21 DIAGNOSIS — I10 Essential (primary) hypertension: Secondary | ICD-10-CM | POA: Diagnosis present

## 2013-05-21 DIAGNOSIS — J9600 Acute respiratory failure, unspecified whether with hypoxia or hypercapnia: Secondary | ICD-10-CM | POA: Diagnosis present

## 2013-05-21 DIAGNOSIS — I69959 Hemiplegia and hemiparesis following unspecified cerebrovascular disease affecting unspecified side: Secondary | ICD-10-CM

## 2013-05-21 DIAGNOSIS — J154 Pneumonia due to other streptococci: Secondary | ICD-10-CM | POA: Diagnosis present

## 2013-05-21 DIAGNOSIS — N179 Acute kidney failure, unspecified: Secondary | ICD-10-CM | POA: Diagnosis present

## 2013-05-21 DIAGNOSIS — R7309 Other abnormal glucose: Secondary | ICD-10-CM | POA: Diagnosis present

## 2013-05-21 DIAGNOSIS — Q2111 Secundum atrial septal defect: Secondary | ICD-10-CM | POA: Diagnosis not present

## 2013-05-21 DIAGNOSIS — I639 Cerebral infarction, unspecified: Secondary | ICD-10-CM

## 2013-05-21 DIAGNOSIS — J690 Pneumonitis due to inhalation of food and vomit: Secondary | ICD-10-CM | POA: Diagnosis present

## 2013-05-21 DIAGNOSIS — D649 Anemia, unspecified: Secondary | ICD-10-CM | POA: Diagnosis present

## 2013-05-21 DIAGNOSIS — E872 Acidosis, unspecified: Secondary | ICD-10-CM | POA: Diagnosis present

## 2013-05-21 DIAGNOSIS — L8992 Pressure ulcer of unspecified site, stage 2: Secondary | ICD-10-CM | POA: Diagnosis present

## 2013-05-21 DIAGNOSIS — F3289 Other specified depressive episodes: Secondary | ICD-10-CM | POA: Diagnosis present

## 2013-05-21 DIAGNOSIS — N390 Urinary tract infection, site not specified: Secondary | ICD-10-CM | POA: Diagnosis present

## 2013-05-21 DIAGNOSIS — R6520 Severe sepsis without septic shock: Secondary | ICD-10-CM

## 2013-05-21 DIAGNOSIS — E785 Hyperlipidemia, unspecified: Secondary | ICD-10-CM | POA: Diagnosis present

## 2013-05-21 DIAGNOSIS — L89152 Pressure ulcer of sacral region, stage 2: Secondary | ICD-10-CM | POA: Diagnosis present

## 2013-05-21 DIAGNOSIS — E870 Hyperosmolality and hypernatremia: Secondary | ICD-10-CM | POA: Diagnosis present

## 2013-05-21 DIAGNOSIS — I69991 Dysphagia following unspecified cerebrovascular disease: Secondary | ICD-10-CM | POA: Diagnosis not present

## 2013-05-21 DIAGNOSIS — R6521 Severe sepsis with septic shock: Secondary | ICD-10-CM

## 2013-05-21 DIAGNOSIS — F329 Major depressive disorder, single episode, unspecified: Secondary | ICD-10-CM | POA: Diagnosis present

## 2013-05-21 DIAGNOSIS — E440 Moderate protein-calorie malnutrition: Secondary | ICD-10-CM | POA: Diagnosis present

## 2013-05-21 HISTORY — DX: Gastro-esophageal reflux disease without esophagitis: K21.9

## 2013-05-21 HISTORY — DX: Major depressive disorder, single episode, unspecified: F32.9

## 2013-05-21 HISTORY — DX: Altered mental status, unspecified: R41.82

## 2013-05-21 HISTORY — DX: Aphasia: R47.01

## 2013-05-21 HISTORY — DX: Muscle weakness (generalized): M62.81

## 2013-05-21 HISTORY — DX: Dysphasia following unspecified cerebrovascular disease: I69.921

## 2013-05-21 HISTORY — DX: Contracture, unspecified hand: M24.549

## 2013-05-21 HISTORY — DX: Essential (primary) hypertension: I10

## 2013-05-21 HISTORY — DX: Hypothyroidism, unspecified: E03.9

## 2013-05-21 HISTORY — DX: Edema, unspecified: R60.9

## 2013-05-21 HISTORY — DX: Cerebral infarction due to thrombosis of unspecified cerebral artery: I63.30

## 2013-05-21 HISTORY — DX: Joint derangement, unspecified: M24.9

## 2013-05-21 HISTORY — DX: Unspecified sequelae of unspecified cerebrovascular disease: I69.90

## 2013-05-21 HISTORY — DX: Pure hypercholesterolemia, unspecified: E78.00

## 2013-05-21 HISTORY — DX: Abnormal posture: R29.3

## 2013-05-21 HISTORY — DX: Unspecified cataract: H26.9

## 2013-05-21 HISTORY — DX: Generalized anxiety disorder: F41.1

## 2013-05-21 HISTORY — DX: Gastrointestinal hemorrhage, unspecified: K92.2

## 2013-05-21 HISTORY — DX: Spastic hemiplegia affecting unspecified side: G81.10

## 2013-05-21 HISTORY — DX: Other specified depressive episodes: F32.89

## 2013-05-21 HISTORY — DX: Vomiting, unspecified: R11.10

## 2013-05-21 HISTORY — DX: Vascular disorder of intestine, unspecified: K55.9

## 2013-05-21 HISTORY — DX: Dysphagia, unspecified: R13.10

## 2013-05-21 LAB — CBC WITH DIFFERENTIAL/PLATELET
BASOS PCT: 0 % (ref 0–1)
Basophils Absolute: 0 10*3/uL (ref 0.0–0.1)
EOS ABS: 0 10*3/uL (ref 0.0–0.7)
EOS PCT: 0 % (ref 0–5)
HCT: 40.8 % (ref 39.0–52.0)
Hemoglobin: 12.6 g/dL — ABNORMAL LOW (ref 13.0–17.0)
LYMPHS ABS: 2 10*3/uL (ref 0.7–4.0)
Lymphocytes Relative: 14 % (ref 12–46)
MCH: 29.6 pg (ref 26.0–34.0)
MCHC: 30.9 g/dL (ref 30.0–36.0)
MCV: 95.8 fL (ref 78.0–100.0)
Monocytes Absolute: 1.4 10*3/uL — ABNORMAL HIGH (ref 0.1–1.0)
Monocytes Relative: 9 % (ref 3–12)
Neutro Abs: 11.1 10*3/uL — ABNORMAL HIGH (ref 1.7–7.7)
Neutrophils Relative %: 77 % (ref 43–77)
Platelets: 231 10*3/uL (ref 150–400)
RBC: 4.26 MIL/uL (ref 4.22–5.81)
RDW: 14.4 % (ref 11.5–15.5)
WBC: 14.5 10*3/uL — ABNORMAL HIGH (ref 4.0–10.5)

## 2013-05-21 LAB — URINE MICROSCOPIC-ADD ON

## 2013-05-21 LAB — COMPREHENSIVE METABOLIC PANEL
ALT: 34 U/L (ref 0–53)
AST: 20 U/L (ref 0–37)
Albumin: 2.6 g/dL — ABNORMAL LOW (ref 3.5–5.2)
Alkaline Phosphatase: 119 U/L — ABNORMAL HIGH (ref 39–117)
BUN: 57 mg/dL — ABNORMAL HIGH (ref 6–23)
CALCIUM: 9.2 mg/dL (ref 8.4–10.5)
CO2: 22 mEq/L (ref 19–32)
Chloride: 113 mEq/L — ABNORMAL HIGH (ref 96–112)
Creatinine, Ser: 1.82 mg/dL — ABNORMAL HIGH (ref 0.50–1.35)
GFR calc Af Amer: 44 mL/min — ABNORMAL LOW (ref >90)
GFR calc non Af Amer: 38 mL/min — ABNORMAL LOW (ref >90)
GLUCOSE: 147 mg/dL — AB (ref 70–99)
Potassium: 4.5 mEq/L (ref 3.7–5.3)
SODIUM: 150 meq/L — AB (ref 137–147)
TOTAL PROTEIN: 8.1 g/dL (ref 6.0–8.3)
Total Bilirubin: 0.3 mg/dL (ref 0.3–1.2)

## 2013-05-21 LAB — I-STAT ARTERIAL BLOOD GAS, ED
Acid-base deficit: 6 mmol/L — ABNORMAL HIGH (ref 0.0–2.0)
BICARBONATE: 20.4 meq/L (ref 20.0–24.0)
O2 SAT: 97 %
Patient temperature: 101.4
TCO2: 22 mmol/L (ref 0–100)
pCO2 arterial: 43.4 mmHg (ref 35.0–45.0)
pH, Arterial: 7.287 — ABNORMAL LOW (ref 7.350–7.450)
pO2, Arterial: 107 mmHg — ABNORMAL HIGH (ref 80.0–100.0)

## 2013-05-21 LAB — I-STAT CG4 LACTIC ACID, ED
Lactic Acid, Venous: 2.23 mmol/L — ABNORMAL HIGH (ref 0.5–2.2)
Lactic Acid, Venous: 3.74 mmol/L — ABNORMAL HIGH (ref 0.5–2.2)

## 2013-05-21 LAB — URINALYSIS, ROUTINE W REFLEX MICROSCOPIC
Bilirubin Urine: NEGATIVE
Glucose, UA: NEGATIVE mg/dL
Ketones, ur: NEGATIVE mg/dL
NITRITE: NEGATIVE
Protein, ur: 30 mg/dL — AB
SPECIFIC GRAVITY, URINE: 1.023 (ref 1.005–1.030)
Urobilinogen, UA: 1 mg/dL (ref 0.0–1.0)
pH: 5 (ref 5.0–8.0)

## 2013-05-21 MED ORDER — SODIUM CHLORIDE 0.9 % IV SOLN
1000.0000 mL | Freq: Once | INTRAVENOUS | Status: AC
Start: 2013-05-21 — End: 2013-05-21
  Administered 2013-05-21: 1000 mL via INTRAVENOUS

## 2013-05-21 MED ORDER — SODIUM CHLORIDE 0.9 % IV SOLN
1000.0000 mL | INTRAVENOUS | Status: DC
  Administered 2013-05-21: 1000 mL via INTRAVENOUS

## 2013-05-21 MED ORDER — SODIUM CHLORIDE 0.9 % IV BOLUS (SEPSIS)
1000.0000 mL | Freq: Once | INTRAVENOUS | Status: AC
  Administered 2013-05-21: 1000 mL via INTRAVENOUS

## 2013-05-21 MED ORDER — SODIUM CHLORIDE 0.9 % IV SOLN
1000.0000 mL | Freq: Once | INTRAVENOUS | Status: AC
  Administered 2013-05-21: 1000 mL via INTRAVENOUS

## 2013-05-21 NOTE — ED Notes (Signed)
MD at bedside. 

## 2013-05-21 NOTE — H&P (Signed)
[facility] History and Physical  Brett GoldenSteven R Fletcher NFA:[id number]:[id number] DOB: 05-11-1950 DOA: Dec 04, 2013  Referring physician: Loma BostonStirling Harper, MD PCP: Eloisa NorthernAMIN, SAAD, MD   Chief Complaint: Fever Sepsis  HPI: Brett GoldenSteven R Mohammad is a 63 y.o. male with prior history of stroke presents to the ED from a SNF with probable sepsis. Reportedly he has been noted to have fevers though not specified. He also has had increased SOB noted of late. There has been no cough noted. Patient has had also increased heart rate. Patient reportedly was noted to have a drop in his oxygen saturations. On arrival here he was cultured and an ABG drawn. Patient had acidosis with primary metabolic component. Patient was started on BIPAP and at the time of evaluation he was on the BIPAP. He is not able to provide any history due to his prior stroke.   Review of Systems:  Patient is not able to provide  Past Medical History  Diagnosis Date  . Hyperlipidemia   . Cataract   . Hypertension   . Stroke   . Contracture of hand joint   . Aphasia   . Dysphagia   . Muscle weakness (generalized)   . Spastic hemiplegia affecting dominant side   . Vomiting alone   . Cerebral thrombosis with cerebral infarction   . Dysphasia, late effect of cerebrovascular disease   . Hemorrhage of gastrointestinal tract, unspecified   . Unspecified late effects of cerebrovascular disease   . Unspecified hypothyroidism   . Depressive disorder, not elsewhere classified   . Unspecified cataract   . Esophageal reflux   . Anxiety state, unspecified   . Unspecified vascular insufficiency of intestine   . Pure hypercholesterolemia   . Unspecified essential hypertension   . Unspecified derangement of joint, multiple sites   . Abnormal posture   . Edema   . Altered mental status    History reviewed. No pertinent past surgical history. Social History:  reports that he has been smoking Cigarettes.  He has been smoking about 0.00 packs per day. He  does not have any smokeless tobacco history on file. He reports that he does not drink alcohol or use illicit drugs.  Allergies  Allergen Reactions  . Doxycycline     Unknown reaction     History reviewed. No pertinent family history.   Prior to Admission medications   Medication Sig Start Date End Date Taking? Authorizing Provider  Amino Acids-Protein Hydrolys (FEEDING SUPPLEMENT, PRO-STAT SUGAR FREE 64,) LIQD Take 30 mLs by mouth daily.   Yes Historical Provider, MD  amLODipine (NORVASC) 5 MG tablet Take 5 mg by mouth daily.   Yes Historical Provider, MD  atorvastatin (LIPITOR) 10 MG tablet Take 10 mg by mouth daily.   Yes Historical Provider, MD  baclofen (LIORESAL) 20 MG tablet Take 20 mg by mouth 4 (four) times daily.   Yes Historical Provider, MD  bisacodyl (DULCOLAX) 5 MG EC tablet Take 5 mg by mouth daily as needed for constipation.   Yes Historical Provider, MD  buPROPion (WELLBUTRIN SR) 150 MG 12 hr tablet Take 150 mg by mouth daily.   Yes Historical Provider, MD  Chlorophyll 3-0.6 MG TABS Take 1 tablet by mouth 2 (two) times daily.   Yes Historical Provider, MD  Cholecalciferol (VITAMIN D3) 50000 UNITS CAPS Take 1 capsule by mouth every 30 (thirty) days.   Yes Historical Provider, MD  escitalopram (LEXAPRO) 5 MG tablet Take 5 mg by mouth daily.   Yes Historical Provider, MD  ezetimibe (  ZETIA) 10 MG tablet Take 10 mg by mouth daily.   Yes Historical Provider, MD  fentaNYL (DURAGESIC - DOSED MCG/HR) 75 MCG/HR Place 1 patch onto the skin every 3 (three) days.   Yes Historical Provider, MD  HYDROcodone-acetaminophen (VICODIN) 5-500 MG per tablet Take 1 tablet by mouth every 6 (six) hours as needed for pain.   Yes Historical Provider, MD  Ipratropium-Albuterol (COMBIVENT IN) Inhale 2 puffs into the lungs every 6 (six) hours as needed.   Yes Historical Provider, MD  levothyroxine (SYNTHROID, LEVOTHROID) 75 MCG tablet Take 75 mcg by mouth daily before breakfast.   Yes Historical  Provider, MD  lisinopril (PRINIVIL,ZESTRIL) 20 MG tablet Take 20 mg by mouth daily.   Yes Historical Provider, MD  LORAZEPAM PO Take 0.25 mg by mouth daily.   Yes Historical Provider, MD  metoCLOPramide (REGLAN) 5 MG tablet Take 5 mg by mouth 4 (four) times daily.   Yes Historical Provider, MD  Multiple Vitamins-Minerals (MULTIVITAMIN PO) Take 1 tablet by mouth daily.   Yes Historical Provider, MD  Omega-3 Fatty Acids (FISH OIL PO) Take 1 capsule by mouth 2 (two) times daily.   Yes Historical Provider, MD  omeprazole (PRILOSEC) 20 MG capsule Take 20 mg by mouth 2 (two) times daily.   Yes Historical Provider, MD  potassium chloride (K-DUR) 10 MEQ tablet Take 10 mEq by mouth daily.   Yes Historical Provider, MD  potassium chloride (K-DUR,KLOR-CON) 10 MEQ tablet Take 10 mEq by mouth 2 (two) times daily. On Mon, Wed, Fri   Yes Historical Provider, MD  senna (SENOKOT) 8.6 MG tablet Take 1 tablet by mouth daily.   Yes Historical Provider, MD  torsemide (DEMADEX) 5 MG tablet Take 5 mg by mouth daily.   Yes Historical Provider, MD  warfarin (COUMADIN) 4 MG tablet Take 4 mg by mouth daily.   Yes Historical Provider, MD  zinc sulfate 220 MG capsule Take 220 mg by mouth daily.   Yes Historical Provider, MD   Physical Exam: Filed Vitals:   May 23, 2013 2230  BP: 118/72  Pulse: 121  Temp:   Resp: 19    BP 118/72  Pulse 121  Temp(Src) 101.4 F (38.6 C) (Rectal)  Resp 19  SpO2 98%  General:  Appears comfortable Eyes: PERRL, normal lids, irises & conjunctiva ENT: grossly normal hearing, lips & tongue Neck: no LAD, masses or thyromegaly Cardiovascular: RRR, no m/r/g. No LE edema. Respiratory: CTA bilaterally, Normal respiratory effort. Abdomen: soft, ntnd Skin: no rash or induration seen on limited exam Musculoskeletal: grossly normal tone BUE/BLE Psychiatric: grossly normal mood and affect, speech fluent and appropriate Neurologic: not moving right side          Labs on Admission:  Basic  Metabolic Panel:  Recent Labs Lab 05/26/2013 2045  NA 150*  K 4.5  CL 113*  CO2 22  GLUCOSE 147*  BUN 57*  CREATININE 1.82*  CALCIUM 9.2   Liver Function Tests:  Recent Labs Lab 05/06/2013 2045  AST 20  ALT 34  ALKPHOS 119*  BILITOT 0.3  PROT 8.1  ALBUMIN 2.6*   No results found for this basename: LIPASE, AMYLASE,  in the last 168 hours No results found for this basename: AMMONIA,  in the last 168 hours CBC:  Recent Labs Lab 05/17/2013 2045  WBC 14.5*  NEUTROABS 11.1*  HGB 12.6*  HCT 40.8  MCV 95.8  PLT 231   Cardiac Enzymes: No results found for this basename: CKTOTAL, CKMB, CKMBINDEX, TROPONINI,  in  the last 168 hours  BNP (last 3 results) No results found for this basename: PROBNP,  in the last 8760 hours CBG: No results found for this basename: GLUCAP,  in the last 168 hours  Radiological Exams on Admission: Dg Chest Port 1 View  2013-07-11   CLINICAL DATA:  Shortness of Breath  EXAM: PORTABLE CHEST - 1 VIEW  COMPARISON:  January 16, 2007  FINDINGS: There is a degree of underlying emphysematous change. There is no edema or consolidation. The heart size is normal. Pulmonary vascularity reflects underlying emphysema. No adenopathy. No pneumothorax. There is postoperative change in the lower cervical spine.  IMPRESSION: No edema or consolidation.  Underlying emphysematous change.   Electronically Signed   By: Bretta BangWilliam  Woodruff M.D.   On: 02015-06-09 21:40    EKG: Independently reviewed. Sinus Tachycardia noted. Patient has some diffuse ST depressions  Assessment/Plan Active Problems:   Sepsis   1. Sepsis -code sepsis called  -cultures drawn -patient has significant acidosis was started on BIPAP but is primarily metabolic -will begin on vanc and zosyn  2. Hypertension -currently pressure is controlled -will monitor and continue with home medications  3. Elevated Glucose -has elevated glucose -will get A1C  4. Hyperlipidemia -repeat  labs  5. History of stroke -baseline expressive aphasis   Code Status: Full Code (must indicate code status--if unknown or must be presumed, indicate so) Family Communication: No family (indicate person spoken with, if applicable, with phone number if by telephone) Disposition Plan: SNF (indicate anticipated LOS)  Time spent: 45min  Yevonne PaxSaadat A Sarena Jezek [facility] Pager [phone]

## 2013-05-21 NOTE — ED Provider Notes (Signed)
CSN: [id number]     Arrival date & time 05/13/2013  2003 History   First MD Initiated Contact with Patient 05/27/2013 2013     Chief Complaint  Patient presents with  . Respiratory Distress     (Consider location/radiation/quality/duration/timing/severity/associated sxs/prior Treatment) HPI  The following history is limited due to patient's inability to communicate verbally:  This is a 63 y.o. male with PMH of CVA (2004, due to PFO, with residual right-sided hemiparesis), hypertension, hypothyroidism, bowel obstruction status post sigmoid colectomy/colostomy in 2008, presenting from SNF with dyspnea, fever, tachypnea, desaturation.  Due to stroke, he is unable to provide further history. Per EMS, symptoms started today.  On their arrival, patient had an O2 saturation of 78%.  The patient was placed on BiPAP and transported here in stable condition.  Past Medical History  Diagnosis Date  . Hyperlipidemia   . Cataract   . Hypertension   . Stroke    History reviewed. No pertinent past surgical history. History reviewed. No pertinent family history. History  Substance Use Topics  . Smoking status: Current Some Day Smoker    Types: Cigarettes  . Smokeless tobacco: Not on file  . Alcohol Use: No    Review of Systems  Constitutional: Positive for fever and chills.  HENT: Negative for facial swelling.   Eyes: Negative for pain and visual disturbance.  Respiratory: Positive for shortness of breath. Negative for chest tightness.   Cardiovascular: Negative for chest pain.  Gastrointestinal: Negative for nausea and vomiting.  Genitourinary: Negative for dysuria.  Musculoskeletal: Negative for arthralgias and myalgias.  Neurological: Negative for headaches.  Psychiatric/Behavioral: Negative for behavioral problems.      Allergies  Doxycycline  Home Medications   Prior to Admission medications   Medication Sig Start Date End Date Taking? Authorizing Provider  Amino Acids-Protein  Hydrolys (FEEDING SUPPLEMENT, PRO-STAT SUGAR FREE 64,) LIQD Take 30 mLs by mouth daily.   Yes Historical Provider, MD  amLODipine (NORVASC) 5 MG tablet Take 5 mg by mouth daily.   Yes Historical Provider, MD  atorvastatin (LIPITOR) 10 MG tablet Take 10 mg by mouth daily.   Yes Historical Provider, MD  baclofen (LIORESAL) 20 MG tablet Take 20 mg by mouth 4 (four) times daily.   Yes Historical Provider, MD  bisacodyl (DULCOLAX) 5 MG EC tablet Take 5 mg by mouth daily as needed for constipation.   Yes Historical Provider, MD  buPROPion (WELLBUTRIN SR) 150 MG 12 hr tablet Take 150 mg by mouth daily.   Yes Historical Provider, MD  Chlorophyll 3-0.6 MG TABS Take 1 tablet by mouth 2 (two) times daily.   Yes Historical Provider, MD  Cholecalciferol (VITAMIN D3) 50000 UNITS CAPS Take 1 capsule by mouth every 30 (thirty) days.   Yes Historical Provider, MD  escitalopram (LEXAPRO) 5 MG tablet Take 5 mg by mouth daily.   Yes Historical Provider, MD  ezetimibe (ZETIA) 10 MG tablet Take 10 mg by mouth daily.   Yes Historical Provider, MD  fentaNYL (DURAGESIC - DOSED MCG/HR) 75 MCG/HR Place 1 patch onto the skin every 3 (three) days.   Yes Historical Provider, MD  HYDROcodone-acetaminophen (VICODIN) 5-500 MG per tablet Take 1 tablet by mouth every 6 (six) hours as needed for pain.   Yes Historical Provider, MD  Ipratropium-Albuterol (COMBIVENT IN) Inhale 2 puffs into the lungs every 6 (six) hours as needed.   Yes Historical Provider, MD  levothyroxine (SYNTHROID, LEVOTHROID) 75 MCG tablet Take 75 mcg by mouth daily before breakfast.  Yes Historical Provider, MD  lisinopril (PRINIVIL,ZESTRIL) 20 MG tablet Take 20 mg by mouth daily.   Yes Historical Provider, MD  LORAZEPAM PO Take 0.25 mg by mouth daily.   Yes Historical Provider, MD  metoCLOPramide (REGLAN) 5 MG tablet Take 5 mg by mouth 4 (four) times daily.   Yes Historical Provider, MD  Multiple Vitamins-Minerals (MULTIVITAMIN PO) Take 1 tablet by mouth daily.    Yes Historical Provider, MD  Omega-3 Fatty Acids (FISH OIL PO) Take 1 capsule by mouth 2 (two) times daily.   Yes Historical Provider, MD  omeprazole (PRILOSEC) 20 MG capsule Take 20 mg by mouth 2 (two) times daily.   Yes Historical Provider, MD  potassium chloride (K-DUR) 10 MEQ tablet Take 10 mEq by mouth daily.   Yes Historical Provider, MD  potassium chloride (K-DUR,KLOR-CON) 10 MEQ tablet Take 10 mEq by mouth 2 (two) times daily. On Mon, Wed, Fri   Yes Historical Provider, MD  senna (SENOKOT) 8.6 MG tablet Take 1 tablet by mouth daily.   Yes Historical Provider, MD  torsemide (DEMADEX) 5 MG tablet Take 5 mg by mouth daily.   Yes Historical Provider, MD  warfarin (COUMADIN) 4 MG tablet Take 4 mg by mouth daily.   Yes Historical Provider, MD  zinc sulfate 220 MG capsule Take 220 mg by mouth daily.   Yes Historical Provider, MD   BP 138/66  Pulse 94  Temp(Src) 101.4 F (38.6 C) (Rectal)  Resp 24  SpO2 96% Physical Exam  Constitutional: He is oriented to person, place, and time. He appears well-developed and well-nourished. No distress.  HENT:  Head: Normocephalic and atraumatic.  Mouth/Throat: No oropharyngeal exudate.  Eyes: Conjunctivae are normal. Pupils are equal, round, and reactive to light. No scleral icterus.  Neck: Normal range of motion. No tracheal deviation present. No thyromegaly present.  Cardiovascular: Normal rate, regular rhythm and normal heart sounds.  Exam reveals no gallop and no friction rub.   No murmur heard. Pulmonary/Chest: Effort normal. No stridor. He has no wheezes. He has rales. He exhibits no tenderness.  Abdominal: Soft. He exhibits no distension and no mass. There is no tenderness. There is no rebound and no guarding.  Stoma intact, without signs of infection  Musculoskeletal: Normal range of motion. He exhibits no edema.  Neurological: He is alert and oriented to person, place, and time.  Skin: Skin is warm and dry. He is not diaphoretic.  Stage II  ulcer to the sacral area, extending to the left buttocks    ED Course  INTUBATION Date/Time: 05/21/2013 1:54 AM Performed by: Loma Boston Authorized by: Loma Boston Consent: Verbal consent obtained. The procedure was performed in an emergent situation. Risks and benefits: risks, benefits and alternatives were discussed Consent given by: patient Imaging studies: imaging studies available Required items: required blood products, implants, devices, and special equipment available Patient identity confirmed: arm band Time out: Immediately prior to procedure a "time out" was called to verify the correct patient, procedure, equipment, support staff and site/side marked as required. Indications: respiratory failure and  hypoxemia Intubation method: direct, video-assisted. Patient status: paralyzed (RSI) Preoxygenation: BVM Sedatives: etomidate Paralytic: rocuronium Laryngoscope size: Miller 3, followed by Glidescope 3. Tube size: 7.5 mm Tube type: cuffed Number of attempts: 2 Ventilation between attempts: BVM Cricoid pressure: yes Cords visualized: yes Post-procedure assessment: chest rise and ETCO2 monitor Breath sounds: equal Cuff inflated: yes ETT to lip: 23 cm Tube secured with: ETT holder Chest x-ray interpreted by me. Chest x-ray  findings: endotracheal tube in appropriate position Patient tolerance: Patient tolerated the procedure well with no immediate complications.   (including critical care time) Labs Review Labs Reviewed  CBC WITH DIFFERENTIAL - Abnormal; Notable for the following:    WBC 14.5 (*)    Hemoglobin 12.6 (*)    Neutro Abs 11.1 (*)    Monocytes Absolute 1.4 (*)    All other components within normal limits  COMPREHENSIVE METABOLIC PANEL - Abnormal; Notable for the following:    Sodium 150 (*)    Chloride 113 (*)    Glucose, Bld 147 (*)    BUN 57 (*)    Creatinine, Ser 1.82 (*)    Albumin 2.6 (*)    Alkaline Phosphatase 119 (*)    GFR calc  non Af Amer 38 (*)    GFR calc Af Amer 44 (*)    All other components within normal limits  I-STAT CG4 LACTIC ACID, ED - Abnormal; Notable for the following:    Lactic Acid, Venous 2.23 (*)    All other components within normal limits  CULTURE, BLOOD (ROUTINE X 2)  CULTURE, BLOOD (ROUTINE X 2)  URINE CULTURE  URINALYSIS, ROUTINE W REFLEX MICROSCOPIC  BLOOD GAS, ARTERIAL    Imaging Review Dg Chest Port 1 View  05/04/2013   CLINICAL DATA:  Shortness of Breath  EXAM: PORTABLE CHEST - 1 VIEW  COMPARISON:  January 16, 2007  FINDINGS: There is a degree of underlying emphysematous change. There is no edema or consolidation. The heart size is normal. Pulmonary vascularity reflects underlying emphysema. No adenopathy. No pneumothorax. There is postoperative change in the lower cervical spine.  IMPRESSION: No edema or consolidation.  Underlying emphysematous change.   Electronically Signed   By: Bretta BangWilliam  Woodruff M.D.   On: 05/10/2013 21:40     EKG Interpretation   Date/Time:  Sunday May 21 2013 20:12:39 EDT Ventricular Rate:  127 PR Interval:  117 QRS Duration: 89 QT Interval:  280 QTC Calculation: 407 R Axis:   82 Text Interpretation:  Age not entered, assumed to be  63 years old for  purpose of ECG interpretation Sinus tachycardia Repol abnrm suggests  ischemia, diffuse leads Sinus tachycardia diffuse st depressions Abnormal  ekg Confirmed by Gerhard MunchLOCKWOOD, ROBERT  MD [phone]) on 05/26/2013 9:37:37 PM      MDM   Final diagnoses:  None    This is a 63 y.o. male with PMH of CVA (2004, due to PFO, with residual right-sided hemiparesis), hypertension, hypothyroidism, bowel obstruction status post sigmoid colectomy/colostomy in 2008, presenting from SNF with dyspnea, fever, tachypnea, desaturation.  Due to stroke, he is unable to provide further history. Per EMS, symptoms started today.  On their arrival, patient had an O2 saturation of 78%.  The patient was placed on BiPAP and transported  here in stable condition.  Examination reveals fever, tachycardia, hypoxia before presentation here. Considering patient's placement in skilled nursing facility, hypoxia, fever, tachycardia, early patient is at a higher risk for sepsis due to pneumonia. However patient does have an indwelling urethral catheter as well as sacral ulcer extending into the left buttocks, so these could also provide foci for infection.  Labs, including cultures, chest x-ray have been ordered. Fluid resuscitation, broad-spectrum antibiotics have been administered.  Blood gas reveals acidosis.  Lactate is 3.7.  Creatinine is abnormally high.  Chest x-ray reveals questionable infiltrates bilaterally.  Patient has leukocytosis of 14 with a left-sided shift.  Patient remains stable and is being admitted to the  hospitalist service for further care.  I have discussed case and care has been guided by my attending physician, Dr. Jeraldine LootsLockwood.  I then called to bedside by nursing to 2 significant hypoxia. Patient's O2 saturation on BiPAP on 100% FiO2 is ranging between 77% and 81%. Due to hypoxic respiratory failure on maximum positive pressure settings, I believe that emergent intubation is indicated at this time. I have discussed the risks and benefits associated with intubation, mechanical ventilation. I've also discussed goals of care with the patient. Patient states that he wants to be intubated if needed, resuscitated if needed. On her the patient's wishes at this time and intubate the patient as it is indicated.  Please refer to procedures area for further details. Patient had episode of hypotension which has resolved with fluid resuscitation. As patient's fourth liter of fluid was running and patient was hypotensive, I did for a brief period of time prepared for central line insertion. However I have appreciated 3 cycles in a row of MAPs above 65, systolic blood pressure above 161110. I do not believe that central line insertion is  indicated at this time. Critical care has presented to bedside and is admitting the patient.  Dr. Preston FleetingGlick was at bedside for intubation, decision-making following intubation and leading up to admission.  Loma BostonStirling Peyson Delao, MD 2013-08-27 [phone]

## 2013-05-21 NOTE — ED Notes (Signed)
Per EMS: [facility]. Staff called medics for tachycardia and low sats. Pt on CPAP on arrival with O2 sats 78%. Pt is alert but disoriented x 4. Respirations equal and labored, skin hot to touch

## 2013-05-21 NOTE — ED Notes (Signed)
Dr Jeraldine LootsLockwood given a copy of lactic acid results 2.23

## 2013-05-22 ENCOUNTER — Inpatient Hospital Stay (HOSPITAL_COMMUNITY): Payer: PRIVATE HEALTH INSURANCE

## 2013-05-22 DIAGNOSIS — J690 Pneumonitis due to inhalation of food and vomit: Secondary | ICD-10-CM | POA: Diagnosis present

## 2013-05-22 DIAGNOSIS — Z515 Encounter for palliative care: Secondary | ICD-10-CM

## 2013-05-22 DIAGNOSIS — J80 Acute respiratory distress syndrome: Secondary | ICD-10-CM | POA: Diagnosis present

## 2013-05-22 DIAGNOSIS — A419 Sepsis, unspecified organism: Secondary | ICD-10-CM | POA: Diagnosis not present

## 2013-05-22 DIAGNOSIS — L89152 Pressure ulcer of sacral region, stage 2: Secondary | ICD-10-CM | POA: Diagnosis present

## 2013-05-22 DIAGNOSIS — N179 Acute kidney failure, unspecified: Secondary | ICD-10-CM | POA: Diagnosis present

## 2013-05-22 DIAGNOSIS — E440 Moderate protein-calorie malnutrition: Secondary | ICD-10-CM | POA: Diagnosis present

## 2013-05-22 DIAGNOSIS — J9600 Acute respiratory failure, unspecified whether with hypoxia or hypercapnia: Secondary | ICD-10-CM

## 2013-05-22 DIAGNOSIS — I639 Cerebral infarction, unspecified: Secondary | ICD-10-CM | POA: Diagnosis present

## 2013-05-22 DIAGNOSIS — I6350 Cerebral infarction due to unspecified occlusion or stenosis of unspecified cerebral artery: Secondary | ICD-10-CM

## 2013-05-22 DIAGNOSIS — I10 Essential (primary) hypertension: Secondary | ICD-10-CM | POA: Diagnosis present

## 2013-05-22 DIAGNOSIS — Z66 Do not resuscitate: Secondary | ICD-10-CM

## 2013-05-22 DIAGNOSIS — E785 Hyperlipidemia, unspecified: Secondary | ICD-10-CM | POA: Diagnosis present

## 2013-05-22 DIAGNOSIS — R0602 Shortness of breath: Secondary | ICD-10-CM | POA: Diagnosis not present

## 2013-05-22 LAB — I-STAT ARTERIAL BLOOD GAS, ED
Acid-base deficit: 8 mmol/L — ABNORMAL HIGH (ref 0.0–2.0)
Bicarbonate: 17.9 mEq/L — ABNORMAL LOW (ref 20.0–24.0)
O2 SAT: 75 %
Patient temperature: 101.4
TCO2: 19 mmol/L (ref 0–100)
pCO2 arterial: 39.1 mmHg (ref 35.0–45.0)
pH, Arterial: 7.276 — ABNORMAL LOW (ref 7.350–7.450)
pO2, Arterial: 49 mmHg — ABNORMAL LOW (ref 80.0–100.0)

## 2013-05-22 LAB — COMPREHENSIVE METABOLIC PANEL
ALBUMIN: 1.9 g/dL — AB (ref 3.5–5.2)
ALT: 28 U/L (ref 0–53)
AST: 20 U/L (ref 0–37)
Alkaline Phosphatase: 90 U/L (ref 39–117)
BUN: 51 mg/dL — ABNORMAL HIGH (ref 6–23)
CHLORIDE: 117 meq/L — AB (ref 96–112)
CO2: 20 mEq/L (ref 19–32)
CREATININE: 1.89 mg/dL — AB (ref 0.50–1.35)
Calcium: 8 mg/dL — ABNORMAL LOW (ref 8.4–10.5)
GFR calc Af Amer: 42 mL/min — ABNORMAL LOW (ref >90)
GFR calc non Af Amer: 36 mL/min — ABNORMAL LOW (ref >90)
GLUCOSE: 155 mg/dL — AB (ref 70–99)
Potassium: 4.1 mEq/L (ref 3.7–5.3)
Sodium: 152 mEq/L — ABNORMAL HIGH (ref 137–147)
Total Bilirubin: 0.8 mg/dL (ref 0.3–1.2)
Total Protein: 6.2 g/dL (ref 6.0–8.3)

## 2013-05-22 LAB — POCT I-STAT 3, ART BLOOD GAS (G3+)
Acid-base deficit: 9 mmol/L — ABNORMAL HIGH (ref 0.0–2.0)
BICARBONATE: 20.1 meq/L (ref 20.0–24.0)
O2 Saturation: 84 %
PCO2 ART: 67.8 mmHg — AB (ref 35.0–45.0)
PH ART: 7.095 — AB (ref 7.350–7.450)
PO2 ART: 76 mmHg — AB (ref 80.0–100.0)
Patient temperature: 103.1
TCO2: 22 mmol/L (ref 0–100)

## 2013-05-22 LAB — GLUCOSE, CAPILLARY
GLUCOSE-CAPILLARY: 124 mg/dL — AB (ref 70–99)
Glucose-Capillary: 142 mg/dL — ABNORMAL HIGH (ref 70–99)
Glucose-Capillary: 167 mg/dL — ABNORMAL HIGH (ref 70–99)

## 2013-05-22 LAB — PROTIME-INR
INR: 4.06 — ABNORMAL HIGH (ref 0.00–1.49)
Prothrombin Time: 37.9 seconds — ABNORMAL HIGH (ref 11.6–15.2)

## 2013-05-22 LAB — HEMOGLOBIN A1C
HEMOGLOBIN A1C: 6 % — AB (ref <5.7)
Mean Plasma Glucose: 126 mg/dL — ABNORMAL HIGH (ref <117)

## 2013-05-22 LAB — APTT: aPTT: 37 seconds (ref 24–37)

## 2013-05-22 LAB — LACTIC ACID, PLASMA: Lactic Acid, Venous: 3.5 mmol/L — ABNORMAL HIGH (ref 0.5–2.2)

## 2013-05-22 LAB — CBC
HCT: 41.5 % (ref 39.0–52.0)
Hemoglobin: 12.6 g/dL — ABNORMAL LOW (ref 13.0–17.0)
MCH: 29.9 pg (ref 26.0–34.0)
MCHC: 30.4 g/dL (ref 30.0–36.0)
MCV: 98.3 fL (ref 78.0–100.0)
Platelets: 240 10*3/uL (ref 150–400)
RBC: 4.22 MIL/uL (ref 4.22–5.81)
RDW: 14.5 % (ref 11.5–15.5)
WBC: 7.1 10*3/uL (ref 4.0–10.5)

## 2013-05-22 LAB — TROPONIN I: Troponin I: <0.3 ng/mL (ref <0.30)

## 2013-05-22 LAB — MRSA PCR SCREENING: MRSA BY PCR: POSITIVE — AB

## 2013-05-22 LAB — URINE CULTURE
COLONY COUNT: NO GROWTH
Culture: NO GROWTH

## 2013-05-22 LAB — PROCALCITONIN: PROCALCITONIN: 13.77 ng/mL

## 2013-05-22 MED ORDER — CHLORHEXIDINE GLUCONATE 0.12 % MT SOLN
15.0000 mL | Freq: Two times a day (BID) | OROMUCOSAL | Status: DC
  Administered 2013-05-22: 15 mL via OROMUCOSAL
  Filled 2013-05-22: qty 15

## 2013-05-22 MED ORDER — BIOTENE DRY MOUTH MT LIQD
15.0000 mL | Freq: Four times a day (QID) | OROMUCOSAL | Status: DC
  Administered 2013-05-22 (×3): 15 mL via OROMUCOSAL

## 2013-05-22 MED ORDER — BIOTENE DRY MOUTH MT LIQD: 15.0000 mL | Freq: Four times a day (QID) | OROMUCOSAL | Status: DC

## 2013-05-22 MED ORDER — IPRATROPIUM-ALBUTEROL 0.5-2.5 (3) MG/3ML IN SOLN
3.0000 mL | RESPIRATORY_TRACT | Status: DC
  Administered 2013-05-22 (×2): 3 mL via RESPIRATORY_TRACT
  Filled 2013-05-22 (×3): qty 3

## 2013-05-22 MED ORDER — SODIUM CHLORIDE 0.9 % IV SOLN
INTRAVENOUS | Status: DC
  Administered 2013-05-22: 10:00:00 via INTRAVENOUS

## 2013-05-22 MED ORDER — LEVOTHYROXINE SODIUM 100 MCG IV SOLR
37.5000 ug | Freq: Every day | INTRAVENOUS | Status: DC
  Administered 2013-05-22: 37.5 ug via INTRAVENOUS
  Filled 2013-05-22: qty 5

## 2013-05-22 MED ORDER — HEPARIN SODIUM (PORCINE) 5000 UNIT/ML IJ SOLN
5000.0000 [IU] | Freq: Three times a day (TID) | INTRAMUSCULAR | Status: DC
  Filled 2013-05-22 (×4): qty 1

## 2013-05-22 MED ORDER — ETOMIDATE 2 MG/ML IV SOLN
INTRAVENOUS | Status: AC
  Administered 2013-05-22: 30 mg
  Filled 2013-05-22: qty 20

## 2013-05-22 MED ORDER — ARTIFICIAL TEARS OP OINT
1.0000 "application " | TOPICAL_OINTMENT | Freq: Three times a day (TID) | OPHTHALMIC | Status: DC
  Administered 2013-05-22 (×2): 1 via OPHTHALMIC
  Filled 2013-05-22: qty 3.5

## 2013-05-22 MED ORDER — ALUM & MAG HYDROXIDE-SIMETH 200-200-20 MG/5ML PO SUSP
30.0000 mL | Freq: Four times a day (QID) | ORAL | Status: DC | PRN
  Filled 2013-05-22: qty 30

## 2013-05-22 MED ORDER — METOPROLOL TARTRATE 1 MG/ML IV SOLN
2.5000 mg | Freq: Once | INTRAVENOUS | Status: AC
  Administered 2013-05-22: 2.5 mg via INTRAVENOUS

## 2013-05-22 MED ORDER — MIDAZOLAM HCL 2 MG/2ML IJ SOLN: 2.0000 mg | INTRAMUSCULAR | Status: DC | PRN

## 2013-05-22 MED ORDER — ESCITALOPRAM OXALATE 5 MG PO TABS: 5.0000 mg | ORAL_TABLET | Freq: Every day | ORAL | Status: DC

## 2013-05-22 MED ORDER — MIDAZOLAM HCL 5 MG/ML IJ SOLN
1.0000 mg/h | INTRAMUSCULAR | Status: DC
  Administered 2013-05-22: 2 mg/h via INTRAVENOUS
  Filled 2013-05-22: qty 10

## 2013-05-22 MED ORDER — ACETAMINOPHEN 325 MG PO TABS: 650.0000 mg | ORAL_TABLET | Freq: Four times a day (QID) | ORAL | Status: DC | PRN

## 2013-05-22 MED ORDER — FENTANYL CITRATE 0.05 MG/ML IJ SOLN: 100.0000 ug | INTRAMUSCULAR | Status: DC | PRN

## 2013-05-22 MED ORDER — SUCCINYLCHOLINE CHLORIDE 20 MG/ML IJ SOLN
INTRAMUSCULAR | Status: AC
  Filled 2013-05-22: qty 1

## 2013-05-22 MED ORDER — SODIUM CHLORIDE 0.9 % IV SOLN
25.0000 ug/h | INTRAVENOUS | Status: DC
  Administered 2013-05-22: 100 ug/h via INTRAVENOUS
  Filled 2013-05-22: qty 50

## 2013-05-22 MED ORDER — VANCOMYCIN HCL 10 G IV SOLR
1250.0000 mg | INTRAVENOUS | Status: DC
  Administered 2013-05-22: 1250 mg via INTRAVENOUS
  Filled 2013-05-22 (×2): qty 1250

## 2013-05-22 MED ORDER — METOPROLOL TARTRATE 1 MG/ML IV SOLN
INTRAVENOUS | Status: AC
  Filled 2013-05-22: qty 5

## 2013-05-22 MED ORDER — NOREPINEPHRINE BITARTRATE 1 MG/ML IJ SOLN
2.0000 ug/min | INTRAVENOUS | Status: DC
  Administered 2013-05-22 (×2): 50 ug/min via INTRAVENOUS
  Filled 2013-05-22 (×2): qty 16

## 2013-05-22 MED ORDER — PIPERACILLIN-TAZOBACTAM 3.375 G IVPB
3.3750 g | Freq: Once | INTRAVENOUS | Status: AC
  Administered 2013-05-22: 3.375 g via INTRAVENOUS
  Filled 2013-05-22: qty 50

## 2013-05-22 MED ORDER — ONDANSETRON HCL 4 MG PO TABS: 4.0000 mg | ORAL_TABLET | Freq: Four times a day (QID) | ORAL | Status: DC | PRN

## 2013-05-22 MED ORDER — VASOPRESSIN 20 UNIT/ML IJ SOLN
0.0300 [IU]/min | INTRAVENOUS | Status: DC
  Administered 2013-05-22: 0.03 [IU]/min via INTRAVENOUS
  Filled 2013-05-22: qty 2.5

## 2013-05-22 MED ORDER — ACETAMINOPHEN 650 MG RE SUPP
650.0000 mg | Freq: Four times a day (QID) | RECTAL | Status: DC | PRN
  Administered 2013-05-22: 650 mg via RECTAL
  Filled 2013-05-22: qty 1

## 2013-05-22 MED ORDER — CISATRACURIUM BOLUS VIA INFUSION
10.0000 mg | Freq: Once | INTRAVENOUS | Status: DC
  Filled 2013-05-22: qty 10

## 2013-05-22 MED ORDER — FENTANYL 25 MCG/HR TD PT72
75.0000 ug | MEDICATED_PATCH | TRANSDERMAL | Status: DC
  Administered 2013-05-22: 75 ug via TRANSDERMAL
  Filled 2013-05-22: qty 3

## 2013-05-22 MED ORDER — FREE WATER
200.0000 mL | Freq: Three times a day (TID) | Status: DC
  Administered 2013-05-22: 200 mL

## 2013-05-22 MED ORDER — ONDANSETRON HCL 4 MG/2ML IJ SOLN: 4.0000 mg | Freq: Four times a day (QID) | INTRAMUSCULAR | Status: DC | PRN

## 2013-05-22 MED ORDER — SODIUM CHLORIDE 0.9 % IJ SOLN
3.0000 mL | Freq: Two times a day (BID) | INTRAMUSCULAR | Status: DC
  Administered 2013-05-22: 3 mL via INTRAVENOUS

## 2013-05-22 MED ORDER — SODIUM CHLORIDE 0.9 % IV SOLN
3.0000 ug/kg/min | INTRAVENOUS | Status: DC
  Administered 2013-05-22: 3 ug/kg/min via INTRAVENOUS
  Filled 2013-05-22 (×2): qty 20

## 2013-05-22 MED ORDER — SODIUM CHLORIDE 0.9 % IV SOLN
INTRAVENOUS | Status: DC
  Administered 2013-05-22: 1000 mL via INTRAVENOUS

## 2013-05-22 MED ORDER — MUPIROCIN 2 % EX OINT
1.0000 "application " | TOPICAL_OINTMENT | Freq: Two times a day (BID) | CUTANEOUS | Status: DC
  Administered 2013-05-22: 1 via NASAL
  Filled 2013-05-22: qty 22

## 2013-05-22 MED ORDER — PIPERACILLIN-TAZOBACTAM 3.375 G IVPB
3.3750 g | Freq: Three times a day (TID) | INTRAVENOUS | Status: DC
  Administered 2013-05-22 (×2): 3.375 g via INTRAVENOUS
  Filled 2013-05-22 (×4): qty 50

## 2013-05-22 MED ORDER — LIDOCAINE HCL (CARDIAC) 20 MG/ML IV SOLN
INTRAVENOUS | Status: AC
  Filled 2013-05-22: qty 5

## 2013-05-22 MED ORDER — CHLORHEXIDINE GLUCONATE 0.12 % MT SOLN: 15.0000 mL | Freq: Two times a day (BID) | OROMUCOSAL | Status: DC

## 2013-05-22 MED ORDER — CHLORHEXIDINE GLUCONATE CLOTH 2 % EX PADS
6.0000 | MEDICATED_PAD | Freq: Every day | CUTANEOUS | Status: DC
  Administered 2013-05-22: 6 via TOPICAL

## 2013-05-22 MED ORDER — FAMOTIDINE IN NACL 20-0.9 MG/50ML-% IV SOLN
20.0000 mg | INTRAVENOUS | Status: DC
  Administered 2013-05-22: 20 mg via INTRAVENOUS
  Filled 2013-05-22 (×2): qty 50

## 2013-05-22 MED ORDER — NOREPINEPHRINE BITARTRATE 1 MG/ML IJ SOLN
2.0000 ug/min | Freq: Once | INTRAVENOUS | Status: AC
  Administered 2013-05-22: 30 ug/min via INTRAVENOUS
  Filled 2013-05-22: qty 4

## 2013-05-22 MED ORDER — ROCURONIUM BROMIDE 50 MG/5ML IV SOLN
INTRAVENOUS | Status: AC
  Administered 2013-05-22: 80 mg
  Filled 2013-05-22: qty 2

## 2013-05-22 MED ORDER — BUPROPION HCL ER (SR) 150 MG PO TB12
150.0000 mg | ORAL_TABLET | Freq: Every day | ORAL | Status: DC
  Filled 2013-05-22: qty 1

## 2013-05-22 MED ORDER — DEXTROSE 5 % IV SOLN
INTRAVENOUS | Status: DC
  Administered 2013-05-22: 05:00:00 via INTRAVENOUS
  Filled 2013-05-22 (×4): qty 150

## 2013-05-22 MED ORDER — NOREPINEPHRINE BITARTRATE 1 MG/ML IJ SOLN
2.0000 ug/min | INTRAVENOUS | Status: DC
  Administered 2013-05-22: 50 ug/min via INTRAVENOUS
  Filled 2013-05-22 (×2): qty 4

## 2013-05-22 MED FILL — Medication: Qty: 1 | Status: AC

## 2013-05-23 NOTE — ED Provider Notes (Addendum)
This patient was seen in conjunction with the resident physician, Dr. Clearance CootsHarper.  The documentation accurately reflects the patient's encounter in the emergency department.   On my exam, this patient with substantial baseline medical issues gave immediate initial impression of being in sepsis. This nursing home report of dyspnea, hypoxia, increased work of breathing was appreciated, consistent with his initial presentation in the emergency. Patient was on noninvasive ventilatory support on arrival. Initial lactic acid was elevated, with a left shift of his leukocytosis sepsis was presumed. Patient had immediate fluid resuscitation here, antibiotics provided. Patient was briefly improved, but decompensated prior to admission requiring intubation. (Please see the note from Dr. Preston FleetingGlick for supervision of intubation procedure.) Given the patient's critical illness he was admitted to the intensive care unit. CRITICAL CARE Performed by: Gerhard Munchobert Kamani Lewter Total critical care time: 40 Critical care time was exclusive of separately billable procedures and treating other patients. Critical care was necessary to treat or prevent imminent or life-threatening deterioration. Critical care was time spent personally by me on the following activities: development of treatment plan with patient and/or surrogate as well as nursing, discussions with consultants, evaluation of patient's response to treatment, examination of patient, obtaining history from patient or surrogate, ordering and performing treatments and interventions, ordering and review of laboratory studies, ordering and review of radiographic studies, pulse oximetry and re-evaluation of patient's condition.   Gerhard Munchobert Deverick Pruss, MD 05/31/2013 [phone]  Patient's EKG on arrival had sinus tachycardia, with diffuse ST depressions, suggesting ischemia, rate 127, abnormal  Gerhard Munchobert Xhaiden Coombs, MD 05/28/2013 [phone]

## 2013-05-24 NOTE — Discharge Summary (Signed)
Physician Discharge Summary       Patient ID: Brett Fletcher MRN: [id number] DOB/AGE: [birth year] 63 y.o. DEATH SUMMARY  Admit date: 05/31/2013 Discharge DEATH date: 12-23-13  16:06   Discharge DEATH Diagnoses:  Principal Problem:   Severe sepsis with septic shock Active Problems:   Hypertension   Hyperlipidemia   Stroke   Acute respiratory failure   ARDS (adult respiratory distress syndrome)   Moderate protein-calorie malnutrition   Acute renal failure   Aspiration pneumonia due to staphlococcal  PNA   Sacral decubitus ulcer, stage II Acute renal injury due to sepsis  Hypocoagulable due to warfarin use    DNR (do not resuscitate)   Comfort measures only status   Detailed Hospital Course:  BRIEF PATIENT DESCRIPTION: [age]- yo M with pmh of 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 Pt  was also found to be febrile on admission with T 101.49F. Due to persistent hypoxia (77-81%) on Bipap in ED, pt required emergent intubation and then started on pressor support due to hypotension despite fluid replacement.  Pt at baseline has expressive aphasia from stroke in 2004 and has history of severe dysphagia with PEG tube placement in 2008, later removed in 2012 . Also with history of pneumonia and ARDS with intubation in 2008. Pt also with history of indwelling urethral catheter and sacral ulcer extending into the left buttocks.   Despite maximal vent/pressor support the pt declined and was made DNR early am hours of 2013/08/09 adn then withdrawal of care resulting in death at 1606 on 4/201/5 Discharge Exam: BP 47/19  Pulse 25  Temp(Src) 99.7 F (37.6 C) (Oral)  Resp 0  Ht 6\' 2"  (1.88 m)  Wt 239 lb 10.2 oz (108.7 kg)  BMI 30.75 kg/m2  SpO2 1%  Pt deceased   Labs at discharge Lab Results  Component Value Date   CREATININE 1.89* 12-23-13   BUN 51* 12-23-13   NA 152* 12-23-13   K 4.1 12-23-13   CL 117* 12-23-13   CO2 20 12-23-13   Lab Results  Component Value Date   WBC 7.1 12-23-13   HGB 12.6* 12-23-13   HCT 41.5 12-23-13   MCV 98.3 12-23-13   PLT 240 12-23-13   Lab Results  Component Value Date   ALT 28 12-23-13   AST 20 12-23-13   ALKPHOS 90 12-23-13   BILITOT 0.8 12-23-13   Lab Results  Component Value  Date   INR 4.06* 05-06-13   INR 1.1 05/16/2008   INR 1.2 05/15/2008    Current radiology studies No results found.  Disposition:  20-Expired     Discharged Condition: deceased    Signed: Irene Shipperatrick E WrightMD 05/24/2013, 4:29 PM

## 2013-05-25 LAB — CULTURE, RESPIRATORY: GRAM STAIN: NONE SEEN

## 2013-05-25 LAB — CULTURE, RESPIRATORY W GRAM STAIN

## 2013-05-28 LAB — CULTURE, BLOOD (ROUTINE X 2)
CULTURE: NO GROWTH
Culture: NO GROWTH

## 2013-06-02 NOTE — ED Notes (Signed)
Level [facility]

## 2013-06-02 NOTE — Procedures (Signed)
Central Venous Catheter Insertion Procedure Note Rennis GoldenSteven R Gacek [id number] 1950/12/02  Procedure: Insertion of Central Venous Catheter Indications: Drug and/or fluid administration  Procedure Details Consent: Unable to obtain consent because of emergent medical necessity. Time Out: Verified patient identification, verified procedure, site/side was marked, verified correct patient position, special equipment/implants available, medications/allergies/relevent history reviewed, required imaging and test results available.  Performed  Maximum sterile technique was used including antiseptics, cap, gloves, gown, hand hygiene, mask and sheet. Skin prep: Chlorhexidine; local anesthetic administered A antimicrobial bonded/coated triple lumen catheter was placed in the left subclavian vein using the Seldinger technique.  Evaluation Blood flow good Complications: No apparent complications Patient did tolerate procedure well. Chest X-ray ordered to verify placement.  CXR: normal.  Storm FriskPatrick E Irys Nigh 05/26/2013, 4:18 AM

## 2013-06-02 NOTE — Progress Notes (Signed)
Chaplain met with family earlier in the day and the family had their own pastoral support present.  Chaplain let them know of spiritual care as a resource and they seemed grateful.  However, with their own pastor present, chaplain ended the visit.

## 2013-06-02 NOTE — ED Notes (Signed)
[facility]

## 2013-06-02 NOTE — ED Notes (Signed)
Level 1 code sepsis called elink for alert per Eric RN 

## 2013-06-02 NOTE — Progress Notes (Signed)
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, PFO on chronic coumadin, large bowel obstruction s/p colectomy/colostomy in 2008, hypothyroidism, HTN, HL, and depression who presents from [facility] care with acute onset dyspnea and hypoxia found to have acute hypoxic respiratory failure and septic shock requiring pressor and mechanical ventilation support due to probable aspiration pneumonia complicated by ARDS  SIGNIFICANT EVENTS / STUDIES:  4/20 Admitted from SNF with septic shock and VDRF due to probable aspiration pneumonia complicated by ARDS  LINES / TUBES: ETT 4/20 >>  Left SCV 4/20 >>  CULTURES: Blood cultures 4/20 >>  Urine cultures 4/20 >>  Bronch wash culture 4/20 >>  ANTIBIOTICS: Vancomycin 4/20 >>  Zosyn 4/20 >>  INTERVAL HISTORY: Patient on max dose of Levophed drip.  SpO2 low 80s despite max ventilator support.  VITAL SIGNS: Temp:  [101.4 F (38.6 C)] 101.4 F (38.6 C) (04/19 2133) Pulse Rate:  [94-157] 147 (04/20 0600) Resp:  [11-29] 28 (04/20 0600) BP: (69-161)/(36-79) 86/51 mmHg (04/20 0600) SpO2:  [71 %-99 %] 80 % (04/20 0600) FiO2 (%):  [100 %] 100 % (04/20 0443) Weight:  [108.7 kg (239 lb 10.2 oz)] 108.7 kg (239 lb 10.2 oz) (04/20 0409)  HEMODYNAMICS: CVP:  [14 mmHg] 14 mmHg  VENTILATOR SETTINGS: Vent Mode:  [-] PCV FiO2 (%):  [100 %] 100 % Set Rate:  [14 bmp-28 bmp] 28 bmp Vt Set:  [580 mL] 580 mL PEEP:  [5 cmH20-14 cmH20] 14 cmH20 Pressure Support:  [10 cmH20-12 cmH20] 10 cmH20 Plateau Pressure:  [18 cmH20-28 cmH20] 28 cmH20  INTAKE / OUTPUT: Intake/Output     04/19 0701 - 04/20 0700 04/20 0701 - 04/21 0700   I.V. (mL/kg) 946 (8.7)     Other 500    IV Piggyback 350    Total Intake(mL/kg) 1796 (16.5)    Urine (mL/kg/hr) 600    Total Output 600     Net +1196            PHYSICAL EXAMINATION: General:  Intubated male  Neuro:  sedated HEENT:  ETT in place Cardiovascular:  Distant HS, tachycardic Lungs:  Coarse anteriorly Abdomen:  Soft, ND, decreased BS Musculoskeletal:  Trace B/L lower extremity edema Skin:  Stage 2 sacral ulcer  LABS:  CBC  Recent Labs Lab 05/14/2013 2045 05/25/2013 0407  WBC 14.5* 7.1  HGB 12.6* 12.6*  HCT 40.8 41.5  PLT 231 240   Coag's  Recent Labs Lab 05/21/2013 0406 05/07/2013 0407  APTT 37  --   INR  --  4.06*   BMET  Recent Labs Lab 05/04/2013 2045 05/29/2013 0407  NA 150* 152*  K 4.5 4.1  CL 113* 117*  CO2 22 20  BUN 57* 51*  CREATININE 1.82* 1.89*  GLUCOSE 147* 155*   Electrolytes  Recent Labs Lab 05/10/2013 2045 05/07/2013 0407  CALCIUM 9.2 8.0*   Sepsis Markers  Recent Labs Lab 05/30/2013 2053 05/07/2013 2321 06/01/2013 0407  LATICACIDVEN 2.23* 3.74* 3.5*  PROCALCITON  --   --  13.77   ABG  Recent Labs Lab 05/27/2013 2239 05/11/2013 0111 05/17/2013 0426  PHART 7.287* 7.276* 7.095*  PCO2ART 43.4 39.1 67.8*  PO2ART 107.0*  49.0* 76.0*   Liver Enzymes  Recent Labs Lab 05/27/2013 2045 2013-06-12 0407  AST 20 20  ALT 34 28  ALKPHOS 119* 90  BILITOT 0.3 0.8  ALBUMIN 2.6* 1.9*   Cardiac Enzymes  Recent Labs Lab 2013-06-12 0407  TROPONINI <0.30   Glucose  Recent Labs Lab 2013-06-12 0249  GLUCAP 124*    Imaging Dg Chest Port 1 View  January 29, 2014   CLINICAL DATA:  Line placements.  Respiratory distress.  EXAM: PORTABLE CHEST - 1 VIEW  COMPARISON:  05/04/2013  FINDINGS: Endotracheal tube tip measures about 5.3 cm above the carinal. Enteric tube tip is in the left upper quadrant consistent with location in the upper stomach. Left subclavian venous catheter is placed with tip over the upper to mid SVC region, possibly of the junction of the SVC and  brachiocephalic veins. No pneumothorax. Normal heart size and pulmonary vascularity. Hazy opacities over the lung bases may represent atelectasis, infiltration, and/or layering pleural effusions. Basilar changes are stable since previous study.  IMPRESSION: Appliances positioned as described. Persistent infiltration, atelectasis, and/or effusions in the lung bases.   Electronically Signed   By: Burman NievesWilliam  Stevens M.D.   On: 0December 28, 2015 04:36   Dg Chest Portable 1 View  January 29, 2014   CLINICAL DATA:  Intubation  EXAM: PORTABLE CHEST - 1 VIEW  COMPARISON:  DG CHEST 1V PORT dated January 29, 2014  FINDINGS: Endotracheal tube placed. Tip is 4.6 cm from the carina. Bilateral central and basilar consolidation is stable. No pneumothorax. Normal heart size.  IMPRESSION: Endotracheal tube placed, 4.6 cm from the carina.   Electronically Signed   By: Maryclare BeanArt  Hoss M.D.   On: 0December 28, 2015 01:48   Dg Chest Portable 1 View  January 29, 2014   CLINICAL DATA:  Respiratory distress  EXAM: PORTABLE CHEST - 1 VIEW  COMPARISON:  DG CHEST 1V PORT dated 05/08/2013  FINDINGS: Dense of bilateral central and basilar consolidation right greater than left. Normal heart size. Upper lungs clear. No pneumothorax.  IMPRESSION: Bibasilar airspace disease right greater than left. Consider aspiration pneumonia.   Electronically Signed   By: Maryclare BeanArt  Hoss M.D.   On: 0December 28, 2015 01:23   Dg Chest Port 1 View  05/04/2013   CLINICAL DATA:  Shortness of Breath  EXAM: PORTABLE CHEST - 1 VIEW  COMPARISON:  January 16, 2007  FINDINGS: There is a degree of underlying emphysematous change. There is no edema or consolidation. The heart size is normal. Pulmonary vascularity reflects underlying emphysema. No adenopathy. No pneumothorax. There is postoperative change in the lower cervical spine.  IMPRESSION: No edema or consolidation.  Underlying emphysematous change.   Electronically Signed   By: Bretta BangWilliam  Woodruff M.D.   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 History of HTN History of HLD P:  Continue Levophed gtt CVP goal > 12  MAP goal > 65  Hold home amlodipine, torsemide, lisinopril, lipitor, zetia  Hold home coumadin in the setting of supra-therapeutic INR Cycle troponins  Consider 2D ECHO  RENAL  A: Acute Renal Failure Cr 1.82 on  admission, baseline ~1, likely pre-renal azotemia vs ATN (hypotension) Hypernatremia likely 2/2 hypovolemia  Metabolic anion gap acidosis 2/2 lactic acidosis P:  Continue fluids Consider D5W if Na not improved  Hold home lisinopril  Monitor BMP   Trend lactic acid levels  Consider renal US if no improvement in renal function   GASTROINTESTINAL  A: Hx of severe dysphagia s/p CVA (2004)  Hx of large bowel obstruction s/p colectomy/colostomy (2008)  Hx of Peg tube (2008-2012)  Hypoalbuminemia  GERD on chronic PPI therapy  P:  NPO, continue IVF's  IV pepcid daily  Consider swallow evaluation   HEMATOLOGIC  A: Hx of PFO on chronic coumadin INR supra-therapeutic 4.6 Normocytic Anemia Hgb 12.6 on admission, no active bleeding  P:  VTE ppx - supra-therapeutic INR, hold coumadin Monitor INR  Monitor CBC    INFECTIOUS  A: Severe Sepsis with AKI and respiratory failure  Aspiration Pneumonia  Chronic indwelling catheter  UTI - UA small leukocytes and large Hgb   P:  Continue Vancomycin and Zosyn  F/u blood and urine cultures  Obtain respiratory culture  PCT level pending   ENDOCRINE  A: Hypothyroidism on replacement therapy   Hyperglycemia   P:  IV synthroid 37.5 mcg daily  Start SSI   Consider TSH   NEUROLOGIC  A: Sedated on vent Hx of embolic stroke (16102004) due to PFO with residual right spastic hemiparesis, expressive aphasia, and severe dysphagia  Depression  Anxiety  Tobacco Abuse  P:  Hold home Lexapro, Wellbutrin, Baclofen  Hold home Fentanty patch and Vicodin  Fentanyl and versed PRN for agitation  Continue Versed/Nimbex gtts Add Fentanyl gtt  SKIN: A:  Stage 2 sacral ulcer P:   Wound care   GOALS OF CARE:  Discussion with family regarding patient's poor prognosis given severe infection in a patient with significant medical problems at baseline.  The patient's mother, brother, two sons and nephew were present and have decided to withdraw supportive care and begin comfort care measures.  Evelena PeatAlex Wilson, DO IMTS, PGY1 05/31/2013, 7:02 AM  Attending:  I have seen and examined the patient with nurse practitioner/resident and agree with the note above.   Chart reviewed, patient examined  Extensive conversations with family;  Worsening shock, oxygenation and acidosis in the setting of multiple chronic illnesses after living in a nursing home for ten years post stroke.  Family understands he will not survive this illness and have decided to withdraw support see my note from today  Additional CC time by me 60 minutes  Heber CarolinaBrent Calob Baskette, MD [facility] PCCM Pager: [phone] Cell: [phone] If no response, call [phone]

## 2013-06-02 NOTE — ED Notes (Addendum)
Spoke with critical care Dr. Delford FieldWright via phone. Notified MD of pt's VS, EDP at bedside

## 2013-06-02 NOTE — ED Notes (Signed)
Respiratory at bedside. Pt continues to have oxygen saturation of 85%. Mask has been readjusted to form a better seal around pt's face without any improvement of oxygen saturation

## 2013-06-02 NOTE — Progress Notes (Signed)
Pt pronounced at 16:06 by Barbaraann FasterJennifer Myrtie Leuthold RN and Crystal Mannus RN. No breath or heart sounds ausculatated. Pupils nonreactive. No palpable pulses. Family at bedside. Funeral information received from family. Emotional support provided.

## 2013-06-02 NOTE — ED Notes (Signed)
Pharmacy called regarding pt's antibiotics

## 2013-06-02 NOTE — Procedures (Signed)
Bronchoscopy Procedure Note Brett GoldenSteven R Fletcher [id number] 11/11/1950  Procedure: Bronchoscopy Indications: Remove secretions  Procedure Details Consent: Unable to obtain consent because of emergent medical necessity. Time Out: Verified patient identification, verified procedure, site/side was marked, verified correct patient position, special equipment/implants available, medications/allergies/relevent history reviewed, required imaging and test results available.  Performed  In preparation for procedure, patient was given 100% FiO2 and bronchoscope lubricated. Sedation: Benzodiazepines  Airway entered and the following bronchi were examined: RUL, RML, RLL, LUL, LLL and Bronchi.   Procedures performed: Brushings performed Bronchoscope removed.    Evaluation Hemodynamic Status: Persistent hypotension treated with pressors and fluid; O2 sats: transiently fell during during procedure Patient's Current Condition: unstable Specimens:  Sent purulent fluid Complications: No apparent complications Patient did tolerate procedure well.   Brett Fletcher 05/10/2013

## 2013-06-02 NOTE — ED Notes (Addendum)
Pt had expired fentanyl patch on right chest. Pt continues to try and express a need but staff is unable to determine the need due to pt's baseline neuro status and speech deficits. Will place a new patch to try and make the pt more comfortable and continue to monitor

## 2013-06-02 NOTE — ED Notes (Signed)
Respiratory called to evaluate the patient and for a blood gas

## 2013-06-02 NOTE — ED Notes (Signed)
Dr. Kahn at bedside

## 2013-06-02 NOTE — Consult Note (Signed)
WOC consulted for sacral ulcer, patient from SNF. Documented as Stage II.  CCM discussion with many members of this patients family at the bedside when I arrived to discuss transition to comfort care.  Partial code at this time.  Nurse will follow skin care order set to address this wound when she turns this patient for her assessment.  Many family members at the bedside, patient max level of IV drips to sustain pressure at this time, not appropriate to turn for assessment now.   Discussed POC with patient and bedside nurse.  Re consult if needed, will not follow at this time. Thanks  Sydney Hasten [employer] RN, CWOCN [phone])

## 2013-06-02 NOTE — ED Notes (Signed)
Fentanyl patches removed

## 2013-06-02 NOTE — ED Notes (Signed)
Level 1 code sepsis called elink for alert per Heron SabinsEric RN

## 2013-06-02 NOTE — ED Notes (Signed)
MD at bedside. 

## 2013-06-02 NOTE — Procedures (Signed)
Extubation Procedure Note  Patient Details:   Name: [name] DOB: [birth year] MRN: [id number]   Airway Documentation:     Evaluation  O2 sats: stable throughout Complications: No apparent complications Patient did tolerate procedure well. Bilateral Breath Sounds: Diminished Suctioning: Airway No, pt not able to speak- pt withdrawal of life support.  Family at bedside, all questions answered.  Pt appeared comfortable t/o process.    Regan RakersSarah J Kaya Klausing Oct 09, 2013, 3:56 PM

## 2013-06-02 NOTE — Progress Notes (Signed)
PCCM  Unable to cannulate R or L femoral artery  Situation grave. Pt maxed out pressors and vent settings  Son enroute.  Do not plan to do cpr or DCCV.  Charlcie CradlePatrick E WrightMD

## 2013-06-02 NOTE — Progress Notes (Signed)
Chaplain requested to offer support to patient's sons. They shared that their mother died from a stroke ten years ago and the patient, their father, had a stroke about 8 years ago and has been in a nursing facility since. They both expressed "being prepared for this" and "expecting this for some time." They said "he's suffered for so long, so we try to focus on his suffering ending." Chaplain provided empathetic listening, emotional and spiritual support, pastoral presence, and prayer. Will refer to unit chaplain for follow up, but please page if needed.   Maurene CapesHillary D Irusta [phone]

## 2013-06-02 NOTE — H&P (Addendum)
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 Pt was also found to be febrile on admission with T 101.73F. Due to persistent hypoxia (77-81%) on Bipap in ED, pt required emergent intubation and then started on pressor support due to hypotension despite fluid replacement.    Pt at baseline has expressive aphasia from stroke in 2004 and has history of severe dysphagia with PEG tube placement in 2008, later removed in 2012 . Also with history of pneumonia and ARDS with intubation in 2008.  Pt also with history of indwelling  urethral catheter and  sacral ulcer extending into the left buttocks.    PAST MEDICAL HISTORY :  Past Medical History  Diagnosis Date  . Hyperlipidemia   . Cataract   . Hypertension   . Stroke   . Contracture of hand joint   . Aphasia   . Dysphagia   . Muscle weakness (generalized)   . Spastic hemiplegia affecting dominant side   . Vomiting alone   . Cerebral thrombosis with cerebral infarction   . Dysphasia, late effect of cerebrovascular disease   . Hemorrhage of gastrointestinal tract, unspecified   . Unspecified late effects of cerebrovascular disease   . Unspecified hypothyroidism   . Depressive disorder, not elsewhere classified   . Unspecified cataract   . Esophageal reflux   . Anxiety state, unspecified   . Unspecified vascular insufficiency of intestine   . Pure hypercholesterolemia   . Unspecified essential hypertension   . Unspecified derangement of joint, multiple sites   . Abnormal posture   . Edema   . Altered mental status    History reviewed. No pertinent past surgical history. Prior to Admission medications   Medication Sig Start Date End Date Taking? Authorizing Provider  Amino Acids-Protein Hydrolys (FEEDING SUPPLEMENT, PRO-STAT SUGAR FREE 64,) LIQD Take 30 mLs by mouth daily.   Yes Historical Provider, MD  amLODipine (NORVASC) 5 MG tablet Take 5 mg by mouth daily.   Yes Historical Provider, MD  atorvastatin (LIPITOR) 10 MG tablet Take 10 mg by mouth daily.   Yes Historical Provider, MD  baclofen (LIORESAL) 20 MG tablet Take 20 mg by  mouth 4 (four) times daily.   Yes Historical Provider, MD  bisacodyl (DULCOLAX) 5 MG EC tablet Take 5 mg by mouth daily as needed for constipation.   Yes Historical Provider, MD  buPROPion (WELLBUTRIN SR) 150 MG 12 hr tablet Take 150 mg by mouth daily.   Yes Historical Provider, MD  Chlorophyll 3-0.6 MG TABS Take 1 tablet by mouth 2 (two) times daily.   Yes Historical Provider, MD  Cholecalciferol (VITAMIN D3) 50000 UNITS  CAPS Take 1 capsule by mouth every 30 (thirty) days.   Yes Historical Provider, MD  escitalopram (LEXAPRO) 5 MG tablet Take 5 mg by mouth daily.   Yes Historical Provider, MD  ezetimibe (ZETIA) 10 MG tablet Take 10 mg by mouth daily.   Yes Historical Provider, MD  fentaNYL (DURAGESIC - DOSED MCG/HR) 75 MCG/HR Place 1 patch onto the skin every 3 (three) days.   Yes Historical Provider, MD  HYDROcodone-acetaminophen (VICODIN) 5-500 MG per tablet Take 1 tablet by mouth every 6 (six) hours as needed for pain.   Yes Historical Provider, MD  Ipratropium-Albuterol (COMBIVENT IN) Inhale 2 puffs into the lungs every 6 (six) hours as needed.   Yes Historical Provider, MD  levothyroxine (SYNTHROID, LEVOTHROID) 75 MCG tablet Take 75 mcg by mouth daily before breakfast.   Yes Historical Provider, MD  lisinopril (PRINIVIL,ZESTRIL) 20 MG tablet Take 20 mg by mouth daily.   Yes Historical Provider, MD  LORAZEPAM PO Take 0.25 mg by mouth daily.   Yes Historical Provider, MD  metoCLOPramide (REGLAN) 5 MG tablet Take 5 mg by mouth 4 (four) times daily.   Yes Historical Provider, MD  Multiple Vitamins-Minerals (MULTIVITAMIN PO) Take 1 tablet by mouth daily.   Yes Historical Provider, MD  Omega-3 Fatty Acids (FISH OIL PO) Take 1 capsule by mouth 2 (two) times daily.   Yes Historical Provider, MD  omeprazole (PRILOSEC) 20 MG capsule Take 20 mg by mouth 2 (two) times daily.   Yes Historical Provider, MD  potassium chloride (K-DUR) 10 MEQ tablet Take 10 mEq by mouth daily.   Yes Historical Provider, MD  potassium chloride (K-DUR,KLOR-CON) 10 MEQ tablet Take 10 mEq by mouth 2 (two) times daily. On Mon, Wed, Fri   Yes Historical Provider, MD  senna (SENOKOT) 8.6 MG tablet Take 1 tablet by mouth daily.   Yes Historical Provider, MD  torsemide (DEMADEX) 5 MG tablet Take 5 mg by mouth daily.   Yes Historical Provider, MD  warfarin (COUMADIN) 4 MG tablet Take 4 mg by mouth daily.   Yes Historical Provider, MD  zinc sulfate 220  MG capsule Take 220 mg by mouth daily.   Yes Historical Provider, MD   Allergies  Allergen Reactions  . Doxycycline     Unknown reaction     FAMILY HISTORY:  History reviewed. No pertinent family history. SOCIAL HISTORY:  reports that he has been smoking Cigarettes.  He has been smoking about 0.00 packs per day. He does not have any smokeless tobacco history on file. He reports that he does not drink alcohol or use illicit drugs.  REVIEW OF SYSTEMS:    Per HPI, full ROS could not be obtained due to pt's AMS   VITAL SIGNS: Temp:  [101.4 F (38.6 C)] 101.4 F (38.6 C) (04/19 2133) Pulse Rate:  [94-137] 135 (04/20 0147) Resp:  [14-29] 14 (04/20 0145) BP: (111-161)/(64-79) 111/64 mmHg (04/20 0145) SpO2:  [78 %-99 %] 91 % (04/20 0145) FiO2 (%):  [100 %] 100 % (04/20  0147) HEMODYNAMICS: Shock state on levophed drip   VENTILATOR SETTINGS: Vent Mode:  [-]  FiO2 (%):  [100 %] 100 % on PRVC 28 Peep 10 Fio2 1.0  IT  INTAKE / OUTPUT: Intake/Output   None     PHYSICAL EXAMINATION: General: obese male intubated  Neuro:  sedated HEENT:  Normocephalic, non-traumatic  Cardiovascular:  Distant heart sounds, normal rhythm, tachycardic, intact pulses   Lungs: anterior ronchi and wheezing Abdomen:  Soft, non-distended, normal BS, colostomy bag in place w/o surrounding erythema Musculoskeletal: trace b/l pedal edema Skin:  Warm, dry, left buttocks Stage 2 sacral ulcer necrotic  LABS:  CBC  Recent Labs Lab 05/07/2013 2045  WBC 14.5*  HGB 12.6*  HCT 40.8  PLT 231   Coag's No results found for this basename: APTT, INR,  in the last 168 hours BMET  Recent Labs Lab 05/16/2013 2045  NA 150*  K 4.5  CL 113*  CO2 22  BUN 57*  CREATININE 1.82*  GLUCOSE 147*   Electrolytes  Recent Labs Lab 05/20/2013 2045  CALCIUM 9.2   Sepsis Markers  Recent Labs Lab 06/01/2013 2053 05/19/2013 2321  LATICACIDVEN 2.23* 3.74*   ABG  Recent Labs Lab 05/12/2013 2239 05/20/2013 0111   PHART 7.287* 7.276*  PCO2ART 43.4 39.1  PO2ART 107.0* 49.0*   Liver Enzymes  Recent Labs Lab 05/20/2013 2045  AST 20  ALT 34  ALKPHOS 119*  BILITOT 0.3  ALBUMIN 2.6*   Cardiac Enzymes No results found for this basename: TROPONINI, PROBNP,  in the last 168 hours Glucose No results found for this basename: GLUCAP,  in the last 168 hours  Imaging Dg Chest Portable 1 View  05/17/2013   CLINICAL DATA:  Intubation  EXAM: PORTABLE CHEST - 1 VIEW  COMPARISON:  DG CHEST 1V PORT dated 05/15/2013  FINDINGS: Endotracheal tube placed. Tip is 4.6 cm from the carina. Bilateral central and basilar consolidation is stable. No pneumothorax. Normal heart size.  IMPRESSION: Endotracheal tube placed, 4.6 cm from the carina.   Electronically Signed   By: Maryclare BeanArt  Hoss M.D.   On: 05/11/2013 01:48   Dg Chest Portable 1 View  05/17/2013   CLINICAL DATA:  Respiratory distress  EXAM: PORTABLE CHEST - 1 VIEW  COMPARISON:  DG CHEST 1V PORT dated 05/18/2013  FINDINGS: Dense of bilateral central and basilar consolidation right greater than left. Normal heart size. Upper lungs clear. No pneumothorax.  IMPRESSION: Bibasilar airspace disease right greater than left. Consider aspiration pneumonia.   Electronically Signed   By: Maryclare BeanArt  Hoss M.D.   On: 05/07/2013 01:23   Dg Chest Port 1 View  05/08/2013   CLINICAL DATA:  Shortness of Breath  EXAM: PORTABLE CHEST - 1 VIEW  COMPARISON:  January 16, 2007  FINDINGS: There is a degree of underlying emphysematous change. There is no edema or consolidation. The heart size is normal. Pulmonary vascularity reflects underlying emphysema. No adenopathy. No pneumothorax. There is postoperative change in the lower cervical spine.  IMPRESSION: No edema or consolidation.  Underlying emphysematous change.   Electronically Signed   By: Bretta BangWilliam  Woodruff M.D.   On: 05/26/2013 21:40      ASSESSMENT / PLAN:  PULMONARY A: Acute hypoxemic respiratory failure requiring mechanical ventilator  support  - setting of possible aspiration PNA and ARDS  Probable Aspiration Pneumonia   -in setting of severe dysphagia s/p CVA  History of ARDS (2008) - Pa02 / Fi02 of 49 with  b/l infiltrates on CXR concerning for ARDS  P:   Continue mechanical ventilation, wean as tolerated  PCV ventilation Repeat ABG  Consider bicarbonate drip if acidosis continues Obtain 2D-echo   CARDIOVASCULAR A: Septic shock   - started on pressor support 4/20 s/p intubation at bedside in ED with MAP 50-60 Patent Foramen Ovale   - on chronic coumadin History of hypertension History of hyperlipidemia  P:  Insert central line  place aline  Start levophed infusion Add vasopressin if needed Septic shock protocol CVP goal > 12 Scvo2 >= 65% MAP goal >65 Hold home amlodipine, torsemide, lisinopril, lipitor, zetia Hold home coumadin until INR check  Cycle troponins Obtain 2D-echo  RENAL A:  Acute Renal Failure     - Cr 1.82 on admission, baseline 1-1. Most likely pre-renal azotemia vs ATN (hypotension) Hypernatremia   - 150 on admission, most likely due to hypovolemia   Metabolic anion gap acidosis  -most likely due to lactic acidosis from septic shock P:   Start NS 125 mL/ hr Consider D5W if Na not improved Hold home lisinopril  Monitor BMP Follow lactic acid levels Consider renal US if no improvement in renal function  GASTROINTESTINAL A:  History of severe dysphagia s/p CVA (2004) History of large bowel obstruction s/p colectomy/colostomy (2008) History of Peg tube (2008-2012)  Hypoalbuminemia   - 2.6 on admission GERD on chronic PPI therapy P:   NPO, continue IVF's IV pepcid daily  Eventual swallow evaluation  HEMATOLOGIC A: History of PFO on chronic coumadin Normocytic Anemia  - Hg 12.6 on admission, no active bleeding DVT Ppx P:  SQ heparin until INR check, then resume home coumadin Monitor INR  Monitor CBC  INFECTIOUS A:  Severe Sepsis   - with AKI and respiratory failure   Aspiration Pneumonia  Stage 2 sacral ulcer Chronic indwelling catheter  - UA small leukocytes and large HgB P:   Continue vancomycin and zosyn  F/U blood and urinary cultures Obtain resp culture   Obtain PCT level Wound care consult  ENDOCRINE A: Hypothyroidism on replacement therapy Hyperglycemia, r/o T2DM  - 147 on admission P:   IV synthroid 37.5 mcg daily  Start SSI  Obtain A1c  Consider TSH   NEUROLOGIC A: Sedation on mechanical ventilation History of embolic stroke (47822004) due to PFO - with residual right spastic hemiparesis, expressive aphasia, and severe dysphagia  Depression Anxiety  Tobacco Abuse P:   hold home lexapro, wellbutrin, baclofen Hold home fentanty patch and vicodin  Fentanyl and versed PRN for agitation  Add versed/nimbex    Otis BraceMarjan Rabbani, MD PGY-1 IMTS CC 60min   I have seen and examined this pt with the PGY 2 and 1.  I agree with the above note with edits. We have updated the son over the phone. Pt remains critically ill. Charlcie Cradleatrick E WrightMD Beeper  [phone]  Cell  [phone]  If no response or cell goes to voicemail, call beeper [phone]  05/12/2013, 1:56 AM

## 2013-06-02 NOTE — Progress Notes (Signed)
Fentanyl 200 mcg wasted. Versed 20 mg wasted with MidwifeCrystal Mannus RN.

## 2013-06-02 NOTE — Progress Notes (Signed)
ANTIBIOTIC CONSULT NOTE - INITIAL  Pharmacy Consult for Vancomycin/Zosyn  Indication: rule out sepsis  Allergies  Allergen Reactions  . Doxycycline     Unknown reaction     Patient Measurements: 83.9 kg  Vital Signs: Temp: 101.4 F (38.6 C) (04/19 2133) Temp src: Rectal (04/19 2133) BP: 111/66 mmHg (04/20 0000) Pulse Rate: 133 (04/20 0000)  Labs:  Recent Labs  05/19/2013 2045  WBC 14.5*  HGB 12.6*  PLT 231  CREATININE 1.82*   Medical History: Past Medical History  Diagnosis Date  . Hyperlipidemia   . Cataract   . Hypertension   . Stroke   . Contracture of hand joint   . Aphasia   . Dysphagia   . Muscle weakness (generalized)   . Spastic hemiplegia affecting dominant side   . Vomiting alone   . Cerebral thrombosis with cerebral infarction   . Dysphasia, late effect of cerebrovascular disease   . Hemorrhage of gastrointestinal tract, unspecified   . Unspecified late effects of cerebrovascular disease   . Unspecified hypothyroidism   . Depressive disorder, not elsewhere classified   . Unspecified cataract   . Esophageal reflux   . Anxiety state, unspecified   . Unspecified vascular insufficiency of intestine   . Pure hypercholesterolemia   . Unspecified essential hypertension   . Unspecified derangement of joint, multiple sites   . Abnormal posture   . Edema   . Altered mental status    Assessment: 63 y/o F from SNF with respiratory distress, fever (Tmax 101.4), leukocytosis (WBC 14.5), lactic acid 3.74, SCr 1.82, other lab as above.   Goal of Therapy:  Vancomycin trough level 15-20 mcg/ml  Plan:  -Vancomycin 1250 mg IV q24h -Zosyn 3.375G IV q8h to be infused over 4 hours -Trend WBC, temp, renal function  -Drug levels as indicated   Madaline Lefeber 05/25/2013,1:17 AM

## 2013-06-02 NOTE — Progress Notes (Signed)
[facility] PCCM  Length conversation with son Riki RuskJeremy, Mother Corrie DandyMary and other family.  They understand that he has a very severe infection and he will not survive this illness.  We discussed the various options going forward and they have elected to withdraw support.  Withdrawal of care order set placed.  Heber CarolinaBrent McQuaid, MD [place] PCCM Pager: [phone] Cell: [phone] If no response, call [phone]

## 2013-06-02 NOTE — ED Notes (Signed)
Dr. Wright at bedside.

## 2013-06-02 NOTE — ED Provider Notes (Signed)
Patient was initially seen by Dr. Jeraldine LootsLockwood, and arrangements made for hospital admission. Respiratory status deteriorated despite BiPAP, and he needed to be intubated. Intubation was done by EM resident under my direct supervision. I was present for the entire procedure.  I saw and evaluated the patient, reviewed the resident's note and I agree with the findings and plan.   Dione Boozeavid Drequan Ironside, MD 05/12/2013 [phone]

## 2013-06-02 DEATH — deceased
